# Patient Record
Sex: Female | Born: 1975
Health system: Southern US, Community
[De-identification: ages and names within clinical notes are randomized; demographics above are authoritative.]

## PROBLEM LIST (undated history)

## (undated) DIAGNOSIS — H4710 Unspecified papilledema: Secondary | ICD-10-CM

## (undated) DIAGNOSIS — F32A Depression, unspecified: Secondary | ICD-10-CM

## (undated) DIAGNOSIS — G43909 Migraine, unspecified, not intractable, without status migrainosus: Secondary | ICD-10-CM

## (undated) DIAGNOSIS — F419 Anxiety disorder, unspecified: Secondary | ICD-10-CM

## (undated) DIAGNOSIS — I10 Essential (primary) hypertension: Secondary | ICD-10-CM

## (undated) DIAGNOSIS — F329 Major depressive disorder, single episode, unspecified: Secondary | ICD-10-CM

## (undated) DIAGNOSIS — E559 Vitamin D deficiency, unspecified: Secondary | ICD-10-CM

## (undated) DIAGNOSIS — R51 Headache: Secondary | ICD-10-CM

## (undated) HISTORY — DX: Major depressive disorder, single episode, unspecified: F32.9

## (undated) HISTORY — DX: Depression, unspecified: F32.A

## (undated) HISTORY — DX: Unspecified papilledema: H47.10

## (undated) HISTORY — PX: EYE SURGERY: SHX253

## (undated) HISTORY — DX: Headache: R51

## (undated) HISTORY — DX: Anxiety disorder, unspecified: F41.9

## (undated) HISTORY — DX: Migraine, unspecified, not intractable, without status migrainosus: G43.909

## (undated) HISTORY — DX: Vitamin D deficiency, unspecified: E55.9

---

## 2000-04-02 ENCOUNTER — Other Ambulatory Visit: Admission: RE | Admit: 2000-04-02 | Discharge: 2000-04-02 | Payer: Self-pay | Admitting: Internal Medicine

## 2000-04-13 ENCOUNTER — Encounter: Payer: Self-pay | Admitting: Emergency Medicine

## 2000-04-13 ENCOUNTER — Emergency Department (HOSPITAL_COMMUNITY): Admission: EM | Admit: 2000-04-13 | Discharge: 2000-04-13 | Payer: Self-pay | Admitting: Emergency Medicine

## 2004-10-12 ENCOUNTER — Emergency Department (HOSPITAL_COMMUNITY): Admission: EM | Admit: 2004-10-12 | Discharge: 2004-10-13 | Payer: Self-pay | Admitting: Emergency Medicine

## 2005-03-18 ENCOUNTER — Encounter: Admission: RE | Admit: 2005-03-18 | Discharge: 2005-06-16 | Payer: Self-pay | Admitting: Family Medicine

## 2005-06-08 ENCOUNTER — Emergency Department (HOSPITAL_COMMUNITY): Admission: EM | Admit: 2005-06-08 | Discharge: 2005-06-09 | Payer: Self-pay | Admitting: Emergency Medicine

## 2007-09-09 ENCOUNTER — Emergency Department (HOSPITAL_BASED_OUTPATIENT_CLINIC_OR_DEPARTMENT_OTHER): Admission: EM | Admit: 2007-09-09 | Discharge: 2007-09-09 | Payer: Self-pay | Admitting: Emergency Medicine

## 2009-08-11 ENCOUNTER — Emergency Department (HOSPITAL_COMMUNITY): Admission: EM | Admit: 2009-08-11 | Discharge: 2009-08-11 | Payer: Self-pay | Admitting: Emergency Medicine

## 2010-02-27 ENCOUNTER — Emergency Department (HOSPITAL_COMMUNITY)
Admission: EM | Admit: 2010-02-27 | Discharge: 2010-02-27 | Payer: Self-pay | Source: Home / Self Care | Admitting: Emergency Medicine

## 2010-11-06 ENCOUNTER — Emergency Department (HOSPITAL_COMMUNITY): Payer: 59

## 2010-11-06 ENCOUNTER — Emergency Department (HOSPITAL_COMMUNITY)
Admission: EM | Admit: 2010-11-06 | Discharge: 2010-11-06 | Disposition: A | Discharge status: Home / Self Care | Payer: 59 | Attending: Emergency Medicine | Admitting: Emergency Medicine

## 2010-11-06 DIAGNOSIS — R0602 Shortness of breath: Secondary | ICD-10-CM | POA: Insufficient documentation

## 2010-11-06 DIAGNOSIS — R0789 Other chest pain: Secondary | ICD-10-CM | POA: Insufficient documentation

## 2010-11-06 DIAGNOSIS — I10 Essential (primary) hypertension: Secondary | ICD-10-CM | POA: Insufficient documentation

## 2010-11-06 DIAGNOSIS — F319 Bipolar disorder, unspecified: Secondary | ICD-10-CM | POA: Insufficient documentation

## 2010-11-06 DIAGNOSIS — R42 Dizziness and giddiness: Secondary | ICD-10-CM | POA: Insufficient documentation

## 2010-11-06 LAB — POCT I-STAT, CHEM 8
BUN: 17 mg/dL (ref 6–23)
Creatinine, Ser: 0.9 mg/dL (ref 0.50–1.10)
Glucose, Bld: 81 mg/dL (ref 70–99)
Hemoglobin: 13.3 g/dL (ref 12.0–15.0)
Sodium: 141 mEq/L (ref 135–145)
TCO2: 22 mmol/L (ref 0–100)

## 2010-11-06 LAB — POCT I-STAT TROPONIN I: Troponin i, poc: 0 ng/mL (ref 0.00–0.08)

## 2010-11-12 ENCOUNTER — Other Ambulatory Visit: Payer: Self-pay

## 2010-11-12 ENCOUNTER — Encounter: Payer: Self-pay | Admitting: *Deleted

## 2010-11-12 ENCOUNTER — Emergency Department (HOSPITAL_BASED_OUTPATIENT_CLINIC_OR_DEPARTMENT_OTHER)
Admission: EM | Admit: 2010-11-12 | Discharge: 2010-11-13 | Disposition: A | Discharge status: Home / Self Care | Payer: 59 | Attending: Emergency Medicine | Admitting: Emergency Medicine

## 2010-11-12 ENCOUNTER — Emergency Department (INDEPENDENT_AMBULATORY_CARE_PROVIDER_SITE_OTHER): Payer: 59

## 2010-11-12 DIAGNOSIS — R0602 Shortness of breath: Secondary | ICD-10-CM

## 2010-11-12 DIAGNOSIS — R079 Chest pain, unspecified: Secondary | ICD-10-CM

## 2010-11-12 DIAGNOSIS — IMO0002 Reserved for concepts with insufficient information to code with codable children: Secondary | ICD-10-CM | POA: Insufficient documentation

## 2010-11-12 DIAGNOSIS — X58XXXA Exposure to other specified factors, initial encounter: Secondary | ICD-10-CM | POA: Insufficient documentation

## 2010-11-12 DIAGNOSIS — I10 Essential (primary) hypertension: Secondary | ICD-10-CM | POA: Insufficient documentation

## 2010-11-12 DIAGNOSIS — Z79899 Other long term (current) drug therapy: Secondary | ICD-10-CM | POA: Insufficient documentation

## 2010-11-12 DIAGNOSIS — S29011A Strain of muscle and tendon of front wall of thorax, initial encounter: Secondary | ICD-10-CM

## 2010-11-12 HISTORY — DX: Essential (primary) hypertension: I10

## 2010-11-12 LAB — CBC
HCT: 33.1 % — ABNORMAL LOW (ref 36.0–46.0)
MCH: 29.6 pg (ref 26.0–34.0)
MCV: 83.8 fL (ref 78.0–100.0)
Platelets: 316 10*3/uL (ref 150–400)
RBC: 3.95 MIL/uL (ref 3.87–5.11)
RDW: 13.6 % (ref 11.5–15.5)
WBC: 9.5 10*3/uL (ref 4.0–10.5)

## 2010-11-12 MED ORDER — IOHEXOL 350 MG/ML SOLN
80.0000 mL | Freq: Once | INTRAVENOUS | Status: AC | PRN
  Administered 2010-11-12: 80 mL via INTRAVENOUS

## 2010-11-12 NOTE — ED Notes (Signed)
Pt c/o chest congestion , seen by Urgent care x 2 weeks ago, here today for same

## 2010-11-12 NOTE — ED Notes (Signed)
Pt states she has had chest pain, nausea, and shob. Pt was seen 5 days ago for same and had + d-dimer but left prior to having CT angio.

## 2010-11-13 LAB — CARDIAC PANEL(CRET KIN+CKTOT+MB+TROPI)
Relative Index: INVALID (ref 0.0–2.5)
Troponin I: <0.3 ng/mL (ref <0.30)

## 2010-11-13 LAB — BASIC METABOLIC PANEL
CO2: 19 mEq/L (ref 19–32)
Chloride: 104 mEq/L (ref 96–112)
GFR calc Af Amer: >60 mL/min (ref >60)
Potassium: 3.3 mEq/L — ABNORMAL LOW (ref 3.5–5.1)

## 2010-11-13 MED ORDER — IBUPROFEN 800 MG PO TABS: 800.0000 mg | ORAL_TABLET | Freq: Three times a day (TID) | ORAL | Status: AC

## 2010-11-13 NOTE — ED Provider Notes (Addendum)
History     CSN: [id number] Arrival date & time: 11/12/2010  9:26 PM   Chief Complaint  Patient presents with  . Nasal Congestion     (Include location/radiation/quality/duration/timing/severity/associated sxs/prior treatment) Patient is a 35 y.o. female presenting with chest pain. The history is provided by the patient. No language interpreter was used.  Chest Pain The chest pain began more than 2 weeks ago. Duration of episode(s) is 3 weeks. Chest pain occurs constantly. The chest pain is unchanged. Associated with: nothing. At its most intense, the pain is at 10/10. The pain is currently at 10/10. The severity of the pain is severe. The quality of the pain is described as sharp. The pain does not radiate. Exacerbated by: nothing. Pertinent negatives for primary symptoms include no fever, no fatigue, no syncope, no shortness of breath, no cough, no wheezing, no palpitations, no abdominal pain, no nausea, no vomiting, no dizziness and no altered mental status.  Pertinent negatives for associated symptoms include no claudication, no diaphoresis, no lower extremity edema, no near-syncope, no numbness, no orthopnea, no paroxysmal nocturnal dyspnea and no weakness. She tried nothing for the symptoms. Risk factors include no known risk factors.  Pertinent negatives for past medical history include no aneurysm, no aortic aneurysm, no aortic dissection, no arrhythmia, no CAD, no cancer, no congenital heart disease, no COPD, no Marfan's syndrome, no MI, no mitral valve prolapse, no PE, no PVD, no rheumatic fever, no seizures and no sickle cell disease.  Pertinent negatives for family medical history include: family history of aortic dissection.  Procedure history is negative for cardiac catheterization.      Past Medical History  Diagnosis Date  . Hypertension      History reviewed. No pertinent past surgical history.  History reviewed. No pertinent family history.  History  Substance Use  Topics  . Smoking status: Never Smoker   . Smokeless tobacco: Not on file  . Alcohol Use: No    OB History    Grav Para Term Preterm Abortions TAB SAB Ect Mult Living                  Review of Systems  Constitutional: Negative for fever, diaphoresis and fatigue.  HENT: Positive for congestion. Negative for facial swelling.   Eyes: Negative for discharge.  Respiratory: Negative for cough, shortness of breath and wheezing.   Cardiovascular: Positive for chest pain. Negative for palpitations, orthopnea, claudication, syncope and near-syncope.  Gastrointestinal: Negative for nausea, vomiting and abdominal pain.  Musculoskeletal: Negative.   Neurological: Negative for dizziness, seizures, weakness and numbness.  Hematological: Negative.   Psychiatric/Behavioral: Negative.  Negative for altered mental status.    Allergies  Review of patient's allergies indicates no known allergies.  Home Medications   Current Outpatient Rx  Name Route Sig Dispense Refill  . CALCIUM CARBONATE ANTACID 500 MG PO CHEW Oral Chew 2 tablets by mouth daily.      Marland Kitchen CLONAZEPAM 0.5 MG PO TABS Oral Take 0.5 mg by mouth 3 (three) times daily as needed. anxiety     . FERROUS SULFATE 325 (65 FE) MG PO TABS Oral Take 325 mg by mouth daily with breakfast.      . GUAIFENESIN 600 MG PO TB12 Oral Take 1,200 mg by mouth 2 (two) times daily.      Marland Kitchen MECLIZINE HCL 25 MG PO TABS Oral Take 25 mg by mouth 3 (three) times daily.      Marland Kitchen METOPROLOL SUCCINATE 25 MG PO TB24 Oral  Take 25 mg by mouth daily.      Marland Kitchen ONE-DAILY MULTI VITAMINS PO TABS Oral Take 1 tablet by mouth daily.      Marland Kitchen NAPROXEN SODIUM 220 MG PO TABS Oral Take 440 mg by mouth 2 (two) times daily with a meal.      . TOPIRAMATE 100 MG PO TABS Oral Take 100 mg by mouth daily.        Physical Exam    BP 131/74  Pulse 57  Temp(Src) 97.8 F (36.6 C) (Oral)  Resp 18  Ht 5\' 5"  (1.651 m)  Wt 285 lb (129.275 kg)  BMI 47.43 kg/m2  SpO2 100%  LMP  10/29/2010  Physical Exam  Constitutional: She is oriented to person, place, and time. She appears well-developed and well-nourished.  HENT:  Head: Normocephalic and atraumatic.  Mouth/Throat: Oropharynx is clear and moist.  Eyes: EOM are normal. Pupils are equal, round, and reactive to light. Right eye exhibits no discharge. Left eye exhibits no discharge.  Neck: Normal range of motion. Neck supple. No JVD present.  Cardiovascular: Normal rate and regular rhythm.   No murmur heard. Pulmonary/Chest: Breath sounds normal. No stridor. She is in respiratory distress. She has no wheezes. She exhibits no tenderness.  Abdominal: Soft. Bowel sounds are normal. There is no rebound and no guarding.  Musculoskeletal: Normal range of motion. She exhibits no edema and no tenderness.  Lymphadenopathy:    She has no cervical adenopathy.  Neurological: She is alert and oriented to person, place, and time.  Skin: Skin is warm and dry.  Psychiatric: She has a normal mood and affect.    ED Course  Procedures  Results for orders placed during the hospital encounter of 11/12/10  CBC      Component Value Range   WBC 9.5  4.0 - 10.5 (K/uL)   RBC 3.95  3.87 - 5.11 (MIL/uL)   Hemoglobin 11.7 (*) 12.0 - 15.0 (g/dL)   HCT 52.8 (*) 41.3 - 46.0 (%)   MCV 83.8  78.0 - 100.0 (fL)   MCH 29.6  26.0 - 34.0 (pg)   MCHC 35.3  30.0 - 36.0 (g/dL)   RDW 24.4  01.0 - 27.2 (%)   Platelets 316  150 - 400 (K/uL)  CARDIAC PANEL(CRET KIN+CKTOT+MB+TROPI)      Component Value Range   Total CK 84  7 - 177 (U/L)   CK, MB 2.4  0.3 - 4.0 (ng/mL)   Troponin I <0.30  <0.30 (ng/mL)   Relative Index RELATIVE INDEX IS INVALID  0.0 - 2.5   BASIC METABOLIC PANEL      Component Value Range   Sodium 138  135 - 145 (mEq/L)   Potassium 3.3 (*) 3.5 - 5.1 (mEq/L)   Chloride 104  96 - 112 (mEq/L)   CO2 19  19 - 32 (mEq/L)   Glucose, Bld 90  70 - 99 (mg/dL)   BUN 10  6 - 23 (mg/dL)   Creatinine, Ser 5.36  0.50 - 1.10 (mg/dL)    Calcium 9.4  8.4 - 10.5 (mg/dL)   GFR calc non Af Amer >60  >60 (mL/min)   GFR calc Af Amer >60  >60 (mL/min)   Dg Chest 2 View  11/06/2010  *RADIOLOGY REPORT*  Clinical Data: Chest pain, shortness of breath  CHEST - 2 VIEW  Comparison: None.  Findings: Lungs are clear. No pleural effusion or pneumothorax.  The heart is top normal in size.  Visualized osseous structures are within  normal limits.  IMPRESSION: No evidence of acute cardiopulmonary disease.  Original Report Authenticated By: Charline Bills, M.D.   Ct Angio Chest W/cm &/or Wo Cm  11/13/2010  *RADIOLOGY REPORT*  Clinical Data:  Chest pain, shortness of breath  CT ANGIOGRAPHY CHEST WITH CONTRAST  Technique:  Multidetector CT imaging of the chest was performed using the standard protocol during bolus administration of intravenous contrast.  Multiplanar CT image reconstructions including MIPs were obtained to evaluate the vascular anatomy.  Contrast:  80 ml Omnipaque 350  Comparison:  11/06/2010 radiograph  Findings:  There is no filling defect within the pulmonary arteries to suggest pulmonary embolism.  The aorta is of normal course and caliber.  The heart is upper normal limits to mildly enlarged.  No pericardial effusion.  No pleural effusion.  No intrathoracic lymphadenopathy.  The central airways are patent.  The upper lungs are clear.  Note that the lung bases are not included on this study.  No acute osseous abnormality.  Limited visualization of the liver bone shows no acute abnormality.  Review of the MIP images confirms the above findings.  IMPRESSION: No pulmonary embolism or acute intrathoracic process.  Original Report Authenticated By: Waneta Martins, M.D.     No diagnosis found.   MDM  Date: 11/13/2010  Rate: 58  Rhythm: sinus bradycardia  QRS Axis: normal  Intervals: normal  ST/T Wave abnormalities: nonspecific ST/T changes  Conduction Disutrbances:none  Narrative Interpretation: LVH with st t changes  Old EKG  Reviewed: unchanged  Follow up in the am with Dr. Paulino Rily, return for worsening symptoms      Nohlan Burdin K Serai Tukes-Rasch, MD 11/13/10 0525  Rylen Hou K Kristiane Morsch-Rasch, MD 11/13/10 0526  Keina Mutch K Lidwina Kaner-Rasch, MD 11/13/10 0526  Alejos Reinhardt K Kaytelynn Scripter-Rasch, MD 12/28/10 [phone]

## 2010-11-21 LAB — CBC
HCT: 35.2 — ABNORMAL LOW
Hemoglobin: 12.1
MCV: 89.7
WBC: 5.8

## 2010-11-21 LAB — COMPREHENSIVE METABOLIC PANEL
Albumin: 4.3
Alkaline Phosphatase: 45
BUN: 13
CO2: 31
Chloride: 97
Creatinine, Ser: 0.8
GFR calc non Af Amer: >60
Glucose, Bld: 106 — ABNORMAL HIGH
Potassium: 3.3 — ABNORMAL LOW
Total Bilirubin: 0.7

## 2010-11-21 LAB — DIFFERENTIAL
Basophils Absolute: 0.1
Basophils Relative: 1
Monocytes Absolute: 0.4
Neutro Abs: 3
Neutrophils Relative %: 53

## 2010-11-21 LAB — URINALYSIS, ROUTINE W REFLEX MICROSCOPIC
Hgb urine dipstick: NEGATIVE
Nitrite: NEGATIVE
Protein, ur: NEGATIVE
Urobilinogen, UA: 0.2

## 2010-11-21 LAB — URINE MICROSCOPIC-ADD ON

## 2010-11-21 LAB — PREGNANCY, URINE: Preg Test, Ur: NEGATIVE

## 2011-12-16 ENCOUNTER — Encounter (HOSPITAL_BASED_OUTPATIENT_CLINIC_OR_DEPARTMENT_OTHER): Payer: Self-pay

## 2011-12-16 ENCOUNTER — Emergency Department (HOSPITAL_BASED_OUTPATIENT_CLINIC_OR_DEPARTMENT_OTHER)
Admission: EM | Admit: 2011-12-16 | Discharge: 2011-12-16 | Disposition: A | Discharge status: Home / Self Care | Payer: Worker's Compensation | Attending: Emergency Medicine | Admitting: Emergency Medicine

## 2011-12-16 ENCOUNTER — Emergency Department (HOSPITAL_BASED_OUTPATIENT_CLINIC_OR_DEPARTMENT_OTHER): Payer: Worker's Compensation

## 2011-12-16 DIAGNOSIS — W010XXA Fall on same level from slipping, tripping and stumbling without subsequent striking against object, initial encounter: Secondary | ICD-10-CM | POA: Insufficient documentation

## 2011-12-16 DIAGNOSIS — Y929 Unspecified place or not applicable: Secondary | ICD-10-CM | POA: Insufficient documentation

## 2011-12-16 DIAGNOSIS — S8990XA Unspecified injury of unspecified lower leg, initial encounter: Secondary | ICD-10-CM | POA: Insufficient documentation

## 2011-12-16 DIAGNOSIS — Y939 Activity, unspecified: Secondary | ICD-10-CM | POA: Insufficient documentation

## 2011-12-16 DIAGNOSIS — W19XXXA Unspecified fall, initial encounter: Secondary | ICD-10-CM

## 2011-12-16 DIAGNOSIS — Z79899 Other long term (current) drug therapy: Secondary | ICD-10-CM | POA: Insufficient documentation

## 2011-12-16 DIAGNOSIS — I10 Essential (primary) hypertension: Secondary | ICD-10-CM | POA: Insufficient documentation

## 2011-12-16 MED ORDER — HYDROCODONE-ACETAMINOPHEN 5-325 MG PO TABS: 1.0000 | ORAL_TABLET | Freq: Three times a day (TID) | ORAL | Status: DC | PRN

## 2011-12-16 MED ORDER — HYDROCODONE-ACETAMINOPHEN 5-325 MG PO TABS
1.0000 | ORAL_TABLET | Freq: Once | ORAL | Status: AC
  Administered 2011-12-16: 1 via ORAL
  Filled 2011-12-16: qty 1

## 2011-12-16 NOTE — ED Notes (Signed)
Slipped in grease at work/felll approx 1pm-pain to left knee and thigh

## 2011-12-16 NOTE — ED Provider Notes (Signed)
History     CSN: [id number]  Arrival date & time 12/16/11  1434   First MD Initiated Contact with Patient 12/16/11 1503      Chief Complaint  Patient presents with  . Fall    (Consider location/radiation/quality/duration/timing/severity/associated sxs/prior treatment) HPI The patient presents after a mechanical fall.  She notes that she was in her usual state of health until the events which occurred just prior to sedation.  The patient states that she slipped, fell onto her left knee, and her left leg and awkwardly behind her.  She denies significant other trauma, any loss of consciousness, any ongoing pain in areas other than her lower extremities and hips.  Since onset has been soreness in both hips and her left knee most prominently.  The pain is worse with motion.  Pain is minimally improved with ice.  No distal dysesthesia or weakness. Past Medical History  Diagnosis Date  . Hypertension     History reviewed. No pertinent past surgical history.  No family history on file.  History  Substance Use Topics  . Smoking status: Never Smoker   . Smokeless tobacco: Not on file  . Alcohol Use: Yes    OB History    Grav Para Term Preterm Abortions TAB SAB Ect Mult Living                  Review of Systems  All other systems reviewed and are negative.    Allergies  Review of patient's allergies indicates no known allergies.  Home Medications   Current Outpatient Rx  Name Route Sig Dispense Refill  . ZOLPIDEM TARTRATE 10 MG PO TABS Oral Take 10 mg by mouth at bedtime as needed.    Marland Kitchen CALCIUM CARBONATE ANTACID 500 MG PO CHEW Oral Chew 2 tablets by mouth daily.      Marland Kitchen CLONAZEPAM 0.5 MG PO TABS Oral Take 0.5 mg by mouth 3 (three) times daily as needed. anxiety     . FERROUS SULFATE 325 (65 FE) MG PO TABS Oral Take 325 mg by mouth daily with breakfast.      . GUAIFENESIN ER 600 MG PO TB12 Oral Take 1,200 mg by mouth 2 (two) times daily.      Marland Kitchen MECLIZINE HCL 25 MG PO TABS  Oral Take 25 mg by mouth 3 (three) times daily.      Marland Kitchen METOPROLOL SUCCINATE ER 25 MG PO TB24 Oral Take 25 mg by mouth daily.      Marland Kitchen ONE-DAILY MULTI VITAMINS PO TABS Oral Take 1 tablet by mouth daily.      Marland Kitchen NAPROXEN SODIUM 220 MG PO TABS Oral Take 440 mg by mouth 2 (two) times daily with a meal.      . TOPIRAMATE 100 MG PO TABS Oral Take 100 mg by mouth daily.        BP 160/94  Pulse 72  Temp 98.8 F (37.1 C) (Oral)  Resp 16  Ht 5' 6.5" (1.689 m)  Wt 286 lb (129.729 kg)  BMI 45.47 kg/m2  SpO2 100%  LMP 12/02/2011  Physical Exam  Nursing note and vitals reviewed. Constitutional: She is oriented to person, place, and time. She appears well-developed and well-nourished. No distress.  HENT:  Head: Normocephalic and atraumatic.  Eyes: Conjunctivae normal and EOM are normal.  Cardiovascular: Normal rate and regular rhythm.   Pulmonary/Chest: Effort normal and breath sounds normal. No stridor. No respiratory distress.  Abdominal: She exhibits no distension.  Musculoskeletal: She exhibits no  edema.       Right hip: She exhibits tenderness and bony tenderness. She exhibits normal range of motion, normal strength, no swelling, no crepitus, no deformity and no laceration.       Left hip: She exhibits tenderness and bony tenderness. She exhibits normal range of motion, normal strength, no swelling, no crepitus, no deformity and no laceration.       Right knee: Normal.       Left knee: She exhibits decreased range of motion and bony tenderness. She exhibits no swelling, no effusion, no ecchymosis, no deformity, no laceration, no erythema, normal alignment, no LCL laxity, normal patellar mobility, normal meniscus and no MCL laxity. tenderness found. Lateral joint line tenderness noted. No medial joint line and no patellar tendon tenderness noted.       Right ankle: Normal.       Left ankle: Normal.  Neurological: She is alert and oriented to person, place, and time. No cranial nerve deficit.    Skin: Skin is warm and dry.  Psychiatric: She has a normal mood and affect.    ED Course  Procedures (including critical care time)  Labs Reviewed - No data to display No results found.   No diagnosis found.    MDM  This is a pleasant female presents after a mechanical fall.  On exam she is in no distress.  She is multiple areas of tenderness to palpation, she is appropriate neurovascular status.  The patient's x-rays were all negative, and given her preserved and the capacity with discomfort more than severe pain, there is low suspicion for occult fracture.  We discussed return precautions, should discharged with analgesics, PMD and orthopedics followup.  Gerhard Munch, MD 12/16/11 1626

## 2013-03-27 ENCOUNTER — Encounter (HOSPITAL_COMMUNITY): Payer: Self-pay

## 2013-03-27 ENCOUNTER — Other Ambulatory Visit (HOSPITAL_COMMUNITY): Payer: 59 | Attending: Psychiatry | Admitting: Psychiatry

## 2013-03-27 DIAGNOSIS — I10 Essential (primary) hypertension: Secondary | ICD-10-CM | POA: Insufficient documentation

## 2013-03-27 DIAGNOSIS — F411 Generalized anxiety disorder: Secondary | ICD-10-CM

## 2013-03-27 DIAGNOSIS — F332 Major depressive disorder, recurrent severe without psychotic features: Secondary | ICD-10-CM | POA: Insufficient documentation

## 2013-03-27 DIAGNOSIS — F331 Major depressive disorder, recurrent, moderate: Secondary | ICD-10-CM

## 2013-03-27 DIAGNOSIS — R45851 Suicidal ideations: Secondary | ICD-10-CM | POA: Insufficient documentation

## 2013-03-27 NOTE — Progress Notes (Signed)
Patient ID: Jessica MalayMichelle Dickerson, female   DOB: 14-Feb-1976, 38 y.o.   MRN: [id number] D:  This is a 38 yr old, single, African American female, who was referred per a previous [facility]-IOP pt, treatment for depressive and anxiety symptoms.  Admits to passive SI.  Denies a plan or intent.  Discussed safety options with pt at length.  Pt able to contract for safety.  No previous suicide attempts or gestures.  No previous psychiatric admissions.  Reports fluctuating appetite, decreased sleep, tearfulness, irritability, poor concentration, anhedonia, poor energy, no motivation and decreased self esteem.  Denies HI or A/V hallucinations.  Pt reports she has been struggling with the above symptoms for years, but they worsened a couple of months ago.  Stressors:  1)  Financial Strain:  Pt is currently filing for bankruptcy.  2)  Job [employer]) of 1 1/2 years.  Pt has been having attendance issues and not meeting her quotas.  3)  Limited support system.  Reports being more isolative.  "My friends are all getting married or having children." Patient's family is in [place], [place].  Family Hx:  Paternal Grandfather struggled with ETOH. Childhood:  Born in [place], [place].  Reports a normal childhood.  Parents were married.  Pt was the only child.  Pt states she had cataracts when she was born.  Had eye surgery at an early age.  Was teased because of wearing thick eye glasses.  Denies any trauma or abuse. Denies any drugs/ETOH issues.  Admits to drinking occasionally.  Denies any legal issues. Reports that her mother and two friends as being her support system.  Pt completed all forms.  Scored 31 on the burns.  Pt will attend [facility]-I[facility] for twelve days.  A:  Oriented pt.  Provided pt with an orientation folder.  Informed Dr. Mila PalmerSharon Wolters (PCP) of admit.  Will refer pt to a therapist and a psychiatrist.  Encouraged support groups.  Inquired if pt or if she's been around anyone who has been to [place] within the past 21 days.  Informed pt to not  attend with any flu-like symptoms.  R:  Pt receptive.

## 2013-03-27 NOTE — Progress Notes (Signed)
    Daily Group Progress Note  Program: IOP  Group Time: 9:00-12:00  Participation Level: Active  Behavioral Response: Appropriate  Type of Therapy:  Individual Therapy  Summary of Progress: Patient met with physician and case manager    Nancie Neas, COUNS

## 2013-03-28 ENCOUNTER — Other Ambulatory Visit (HOSPITAL_COMMUNITY): Payer: 59 | Admitting: Psychiatry

## 2013-03-28 ENCOUNTER — Encounter (HOSPITAL_COMMUNITY): Payer: Self-pay | Admitting: Psychiatry

## 2013-03-28 DIAGNOSIS — F329 Major depressive disorder, single episode, unspecified: Secondary | ICD-10-CM

## 2013-03-28 DIAGNOSIS — F32A Depression, unspecified: Secondary | ICD-10-CM

## 2013-03-28 NOTE — Progress Notes (Signed)
Psychiatric Assessment Adult  Patient Identification:  Jessica Fritz Date of Evaluation:  03/28/2013  Chief Complaint: Depression   HPI Patient is a 38 year old African American single employed female who is referred to intensive outpatient program for the management of depression.  Patient was recommended by a previous patient who did outpatient program.  Patient endorses increased depression and anxiety symptoms.  She has multiple stressors.  She has financial strain and recently filed for bankruptcy, her job is very stressful.  She is working at [employer] for more than a year and recently she is unable to meet her course.  She has limited family and social support.  Her parents live in [place], she is single and she does not have any contact with family members who live in this area.  She is single she has no children.  She feels sometimes lonely.  She was given Klonopin and Celexa by her primary care physician and she feels sometimes this is not working.  She admitted to crying spells, irritability, some mood swings, anhedonia with chronic feeling of hopelessness and helplessness.  She also endorsed passive suicidal thoughts but no plan.  She feels lack of energy, lack of attention and concentration.  There were no paranoia, delusion or any of the session but admitted isolation and socially withdrawn.  She endorsed she's been struggling with these symptoms for a long time however lately the symptoms are getting more intense and more frequent.  She admitted some days she does not go to work because she has no motivation or desire to do things.  She has missed work and she has a problem to keep the attendance.  Patient denies any drinking or using any illegal substances.  She denies any mania, hallucinations, paranoia, aggression or any violence.  She denies any flight of ideas, panic attack or any OCD symptoms. Review of Systems Physical Exam  Traumatic Brain Injury: No   Past Psychiatric  History: Diagnosis: Depression   Hospitalizations: Denies   Outpatient Care: Seen by Dr. Tomasa Randunningham but did not like and getting medication from primary care physician.    Substance Abuse Care: Denies   Self-Mutilation: Denies   Suicidal Attempts: Denies   Violent Behaviors: Denies    Past Medical History:   Past Medical History  Diagnosis Date  . Hypertension   . Anxiety   . Depression   . Headache(784.0)    History of Loss of Consciousness:  No Seizure History:  No Cardiac History:  No Allergies:  No Known Allergies Current Medications:  Current Outpatient Prescriptions  Medication Sig Dispense Refill  . atenolol (TENORMIN) 25 MG tablet Take 25 mg by mouth daily.      . calcium carbonate (TUMS - DOSED IN MG ELEMENTAL CALCIUM) 500 MG chewable tablet Chew 2 tablets by mouth daily.        . citalopram (CELEXA) 20 MG tablet Take 20 mg by mouth daily.      . clonazePAM (KLONOPIN) 0.5 MG tablet Take 0.5 mg by mouth 3 (three) times daily as needed. anxiety       . guaiFENesin (MUCINEX) 600 MG 12 hr tablet Take 1,200 mg by mouth 2 (two) times daily.        . Multiple Vitamin (MULTIVITAMIN) tablet Take 1 tablet by mouth daily.        Marland Kitchen. zolpidem (AMBIEN) 10 MG tablet Take 10 mg by mouth at bedtime as needed.       No current facility-administered medications for this visit.  Previous Psychotropic Medications:  Medication Dose   Celexa 20 mg daily   20 mg   Klonopin 0.5 mg                    Substance Abuse History in the last 12 months: Patient denies any history of alcohol or any illegal substance use.  Medical Consequences of Substance Abuse: Denies  Legal Consequences of Substance Abuse: Denies  Family Consequences of Substance Abuse: Denies  Blackouts:  No DT's:  No Withdrawal Symptoms:  No None  Social History: Current Place of Residence: Lives alone Place of Birth: [place] Family Members: Patient lives in [place] Marital Status:  Single Children:  None Relationships: None History of Abuse: none Occupational Experiences; Military History:  None. Legal History: None  Family History:   Family History  Problem Relation Age of Onset  . Alcohol abuse Paternal Grandfather     Mental Status Examination/Evaluation: Objective:  Appearance: Casual and Guarded  Eye Contact::  Fair  Speech:  Slow  Volume:  Decreased  Mood:  Sad and depressed   Affect:  Constricted  Thought Process:  Logical  Orientation:  Full (Time, Place, and Person)  Thought Content:  Rumination and Poverty of thought content  Suicidal Thoughts:  Yes.  without intent/plan  Homicidal Thoughts:  No  Judgement:  Fair  Insight:  Fair  Psychomotor Activity:  Decreased  Akathisia:  No  Handed:  Right  AIMS (if indicated):  None   Assets:  Communication Skills Desire for Improvement Housing     AXIS I Major Depression, Recurrent severe  AXIS II Deferred  AXIS III Past Medical History  Diagnosis Date  . Hypertension   . Anxiety   . Depression   . Headache(784.0)      AXIS IV economic problems, occupational problems, other psychosocial or environmental problems, problems related to social environment and problems with primary support group  AXIS V 51-60 moderate symptoms   Treatment Plan/Recommendations:  Plan of Care: Admit to intensive outpatient program   Laboratory:  None  Psychotherapy: Patient will start program and involved in groups   Medications: Consider increasing Celexa   Routine PRN Medications:  Klonopin as needed  Consultations: None   Safety Concerns:  Patient has passive suicidal thinking but no plan or any intent   Other:      [facility] 2/3/201511:48 AM

## 2013-03-28 NOTE — Progress Notes (Signed)
Patient ID: Martyn MalayMichelle Laux, female   DOB: [birth year], 38 y.o.   MRN: [id number]  Daily Group Progress Note  Program: IOP  Group Time: 9:00-10:30 am   Participation Level: Active   Behavioral Response: Appropriate, tearful  Type of Therapy: Process Group   Summary of Progress: Pt participated in group therapy portion of group, indicated that she tends to worry about finances, work stress and family member's expectations of her.   Group Time: 10:30 am - 12:00 pm   Participation Level: Active   Behavioral Response: Appropriate   Type of Therapy: Psycho-education Group   Summary of Progress: Pt participated in skills group focused on defining and developing healthy relationship boundaries.  [name], Ph.D., [facility]

## 2013-03-29 ENCOUNTER — Encounter (HOSPITAL_COMMUNITY): Payer: Self-pay | Admitting: Psychiatry

## 2013-03-29 ENCOUNTER — Other Ambulatory Visit (HOSPITAL_COMMUNITY): Payer: 59 | Admitting: Psychiatry

## 2013-03-29 DIAGNOSIS — F329 Major depressive disorder, single episode, unspecified: Secondary | ICD-10-CM

## 2013-03-29 DIAGNOSIS — F32A Depression, unspecified: Secondary | ICD-10-CM

## 2013-03-29 NOTE — Progress Notes (Signed)
Patient ID: Martyn MalayMichelle Gero, female   DOB: [birth year], 38 y.o.   MRN: [id number]  Daily Group Progress Note  Program: IOP  Group Time: 9:00-10:30   Participation Level: Active   Behavioral Response: Appropriate and Sharing   Type of Therapy: Group Therapy   Summary of Progress: Pt. report positive mood, smiles and laughs appropriately. Pt. Attributes positive mood to having slept well the night before. Pt. Discussed poor boundaries with parents who live in [place] but are over-involved in her life, inhibit her ability to feel independent.  Group Time: 10:45-12:00   Participation Level: Active   Behavioral Response: Appropriate and Sharing   Type of Therapy: Psycho-education Group   Summary of Progress: Pt. Participated in part 2 of skills group based on developing healthy relationship boundaries.  Shaune PollackBrown, Jennifer B, COUNS

## 2013-03-30 ENCOUNTER — Other Ambulatory Visit (HOSPITAL_COMMUNITY): Payer: 59 | Admitting: Psychiatry

## 2013-03-30 ENCOUNTER — Encounter (HOSPITAL_COMMUNITY): Payer: Self-pay | Admitting: Psychiatry

## 2013-03-30 DIAGNOSIS — F329 Major depressive disorder, single episode, unspecified: Secondary | ICD-10-CM

## 2013-03-30 DIAGNOSIS — F32A Depression, unspecified: Secondary | ICD-10-CM

## 2013-03-30 NOTE — Progress Notes (Signed)
Patient ID: Jessica Fritz, female   DOB: [birth year], 38 y.o.   MRN: [id number]  Program: IOP  Group Time: 9:00-10:30   Participation Level: Minimal   Behavioral Response: Distracted   Type of Therapy: Group Therapy   Summary of Progress: Pt. Reports euthymic mood. Participated in [employer] meditation. Pt. Was alert but did not share in group therapy portion of group.  Group Time: 10:30-12:00   Participation Level: Active   Behavioral Response: Appropriate and Sharing   Type of Therapy: Psycho-education Group   Summary of Progress: Pt. Participated in discussion about creating healthy boundaries. Pt. Was alert but did not share in psychoeducational portion of group.  [name], Ph.D., [facility]

## 2013-03-31 ENCOUNTER — Other Ambulatory Visit (HOSPITAL_COMMUNITY): Payer: 59

## 2013-03-31 DIAGNOSIS — F32A Depression, unspecified: Secondary | ICD-10-CM

## 2013-03-31 DIAGNOSIS — F329 Major depressive disorder, single episode, unspecified: Secondary | ICD-10-CM

## 2013-03-31 NOTE — Progress Notes (Unsigned)
Patient ID: Jessica MalayMichelle Fritz, female   DOB: 08-Apr-1975, 38 y.o.   MRN: [id number]  Program: IOP  Group Time: 9:00-10:30   Participation Level: Moderate  Behavioral Response: Depressed, tearful, sharing  Type of Therapy: Group Therapy   Summary of Progress: Pt. Participated in heartmath meditation. Pt. Discussed pain/shame of friends identifying her depression and challenge of asking for what she needs from sources of support. Pt. Identified that she needs understanding and patience from her support system. Pt. Shared that she feels pressure from her parents to get married and have children; feels burden of not meeting their expectations for what she should have at this point in her life.  Group Time: 10:30-12:00   Participation Level: Moderate  Behavioral Response: Depressed, tearful, sharing  Type of Therapy: Psycho-education Group   Summary of Progress: Pt. Participated in discussion about the use of journaling as a self-awareness and coping tool. [name], Ph.D., [facility]

## 2013-04-03 ENCOUNTER — Other Ambulatory Visit (HOSPITAL_COMMUNITY): Payer: 59 | Admitting: Psychiatry

## 2013-04-03 ENCOUNTER — Encounter (HOSPITAL_COMMUNITY): Payer: Self-pay | Admitting: Psychiatry

## 2013-04-03 DIAGNOSIS — F329 Major depressive disorder, single episode, unspecified: Secondary | ICD-10-CM

## 2013-04-03 DIAGNOSIS — F32A Depression, unspecified: Secondary | ICD-10-CM

## 2013-04-03 NOTE — Progress Notes (Signed)
    Daily Group Progress Note  Program: IOP  Group Time: 9:00-10:30  Participation Level: Minimal  Behavioral Response: Appropriate and Sharing  Type of Therapy:  Group Therapy  Summary of Progress: Pt. Indicated ok mood during morning check-in. Pt. Participated in heartmath meditation. Pt. Appeared flat and lethargic. Pt. Reported that she did not sleep well last night, got out of bed and went on the internet. Pt. Reported that she was challenged to get out of bed on Sunday, but was able to go to church and to the park with a friend's encouragement.     Group Time: 10:30-12:00  Participation Level:  Minimal  Behavioral Response: Appropriate  Type of Therapy: Psycho-education Group  Summary of Progress: Pt. Participated in grief/loss facilitated by Precious GildingBob Hamilton Brown, Jennifer B, COUNS

## 2013-04-04 ENCOUNTER — Other Ambulatory Visit (HOSPITAL_COMMUNITY): Payer: 59 | Admitting: Psychiatry

## 2013-04-04 ENCOUNTER — Encounter (HOSPITAL_COMMUNITY): Payer: Self-pay | Admitting: Psychiatry

## 2013-04-04 DIAGNOSIS — F329 Major depressive disorder, single episode, unspecified: Secondary | ICD-10-CM

## 2013-04-04 DIAGNOSIS — F32A Depression, unspecified: Secondary | ICD-10-CM

## 2013-04-04 MED ORDER — TRAZODONE HCL 50 MG PO TABS: ORAL_TABLET | ORAL | Status: DC

## 2013-04-04 NOTE — Progress Notes (Signed)
Patient ID: Jessica MalayMichelle Fritz, female   DOB: 12-12-1975, 38 y.o.   MRN: [id number] Patient seen today because she was complaining of poor sleep.  She feels her Ambien is not working.  She is taking Klonopin at bedtime however she is only sleeping 2-3 hours.  I recommend to stop Ambien.  We will try trazodone 50 mg half to one tablet at bedtime.  Discussed risk and benefits of medication.

## 2013-04-04 NOTE — Progress Notes (Signed)
    Daily Group Progress Note  Program: IOP  Group Time: 9:00-10:30  Participation Level: Active  Behavioral Response: Appropriate and Sharing  Type of Therapy:  Group Therapy  Summary of Progress: Pt. Shared that she felt tired, lethargic. Pt. Shared that she has not been sleeping well, indicated that use of electronics, internet, reality tv interfere with developing healthy bedtime routine.     Group Time: 10:30-12:00  Participation Level:  Active  Behavioral Response: Appropriate  Type of Therapy: Psycho-education Group  Summary of Progress: Pt. Participated in group focused on identifying cognitive distortions.  Shaune PollackBrown, Jennifer B, COUNS

## 2013-04-05 ENCOUNTER — Other Ambulatory Visit (HOSPITAL_COMMUNITY): Payer: 59

## 2013-04-05 ENCOUNTER — Telehealth (HOSPITAL_COMMUNITY): Payer: Self-pay | Admitting: Psychiatry

## 2013-04-06 ENCOUNTER — Other Ambulatory Visit (HOSPITAL_COMMUNITY): Payer: 59 | Admitting: Psychiatry

## 2013-04-06 ENCOUNTER — Encounter (HOSPITAL_COMMUNITY): Payer: Self-pay | Admitting: Psychiatry

## 2013-04-06 DIAGNOSIS — F32A Depression, unspecified: Secondary | ICD-10-CM

## 2013-04-06 DIAGNOSIS — F329 Major depressive disorder, single episode, unspecified: Secondary | ICD-10-CM

## 2013-04-06 NOTE — Progress Notes (Signed)
Patient ID: Martyn MalayMichelle Kracke, female   DOB: 02-May-1975, 38 y.o.   MRN: [id number]  Program: IOP  Group Time: 9:00-10:30   Participation Level: Active   Behavioral Response: Appropriate and Sharing   Type of Therapy: Group Therapy   Summary of Progress: Pt. Continues to present as alert, but quiet and guarded, sometimes tearful. Pt. Participated in process of theme related to stress of meeting seemingly unattainable expectatins, lack of control over job related outcomes, and feeling undervalued by employer.  Group Time: 10:30-12:00   Participation Level: Active   Behavioral Response: Appropriate   Type of Therapy: Psycho-education Group   Summary of Progress: Pt. Participated in group facilitated by the mental health association. Shaune PollackBrown, Jennifer B, COUNS

## 2013-04-07 ENCOUNTER — Encounter (HOSPITAL_COMMUNITY): Payer: Self-pay | Admitting: Psychiatry

## 2013-04-07 ENCOUNTER — Other Ambulatory Visit (HOSPITAL_COMMUNITY): Payer: 59 | Admitting: Psychiatry

## 2013-04-07 DIAGNOSIS — F32A Depression, unspecified: Secondary | ICD-10-CM

## 2013-04-07 DIAGNOSIS — F329 Major depressive disorder, single episode, unspecified: Secondary | ICD-10-CM

## 2013-04-07 NOTE — Progress Notes (Signed)
Patient ID: Martyn MalayMichelle Bruning, female   DOB: [birth year], 38 y.o.   MRN: [id number] Program: IOP  Group Time: 9:00-10:30   Participation Level: Active   Behavioral Response: Appropriate and Sharing   Type of Therapy: Group Therapy   Summary of Progress: Pt. Continues to present as alert, but quiet and guarded, sometimes tearful. Pt. Participated in process of theme related to identifying sources of anger, normalizing anger, and lessons that can be learned during periods of anger. Pt. Shared that accessing anger is a significant challenge for her and discussed childhood experiences that developed discomfort with expressing anger.  Group Time: 10:30-12:00   Participation Level: Active   Behavioral Response: Appropriate   Type of Therapy: Psycho-education Group   Summary of Progress: Pt. Participated in group focused on identifying core beliefs. Shaune PollackBrown, Jennifer B, COUNS

## 2013-04-07 NOTE — Progress Notes (Signed)
Patient ID: Jessica Fritz, female   DOB: [birth year], 38 y.o.   MRN: [id number] D:  Met briefly with pt.  Reports that the groups are helping her to learn better coping skills.  Continues to struggle with sadness and anxiety.  C/O decreased self esteem, indecisiveness, anhedonia, irritability, decreased concentration, motivation, appetite and sleep (3-5 hrs).  Pt continues to be very tearful.  Admits to SI, denies a plan or intent.  Discussed safety options with pt.  Pt able to contract for safety.  A:  Provided pt with support and feedback.  Pt to continue in [place].  R:  Pt receptive.

## 2013-04-10 ENCOUNTER — Encounter (HOSPITAL_COMMUNITY): Payer: Self-pay | Admitting: Psychiatry

## 2013-04-10 ENCOUNTER — Other Ambulatory Visit (HOSPITAL_COMMUNITY): Payer: 59 | Admitting: Psychiatry

## 2013-04-10 DIAGNOSIS — F329 Major depressive disorder, single episode, unspecified: Secondary | ICD-10-CM

## 2013-04-10 DIAGNOSIS — F32A Depression, unspecified: Secondary | ICD-10-CM

## 2013-04-10 NOTE — Progress Notes (Signed)
Patient ID: Martyn MalayMichelle Dresch, female   DOB: [birth year], 38 y.o.   MRN: [id number]  Group Time: 9:00-10:30   Participation Level: Active   Behavioral Response: Appropriate and Sharing   Type of Therapy: Group Therapy   Summary of Progress: Pt. Continues to present as alert, but quiet. Pt. Participated in heartmath meditation and guided visualization. Pt. Reported that she was feeling "so-so". Pt. Reports that she had uneventful weekend. Pt. Continues to be challenged to make meaningful connections during group process. Session focused on challenge of disconnection and sharing our needs with others who might be sources of support.  Group Time: 10:30-12:00   Participation Level: Active   Behavioral Response: Appropriate   Type of Therapy: Psycho-education Group   Summary of Progress: Pt. Participated in group focused on process of forgiveness. Shaune PollackBrown, Jennifer B, COUNS

## 2013-04-11 ENCOUNTER — Other Ambulatory Visit (HOSPITAL_COMMUNITY): Payer: 59

## 2013-04-12 ENCOUNTER — Other Ambulatory Visit (HOSPITAL_COMMUNITY): Payer: 59 | Admitting: Psychiatry

## 2013-04-12 DIAGNOSIS — F329 Major depressive disorder, single episode, unspecified: Secondary | ICD-10-CM

## 2013-04-12 DIAGNOSIS — F32A Depression, unspecified: Secondary | ICD-10-CM

## 2013-04-12 NOTE — Progress Notes (Signed)
Discharge Note  Patient:  Jessica MalayMichelle Fritz is an 38 y.o., female DOB:  Nov 04, 1975  Date of Admission:  03/27/2013  Date of Discharge: 04/12/2013   Reason for Admission: Depression and anxiety  Hospital Course: Patient started IOP and was continued on her medications which included Celexa 20 mg Klonopin 0.5 mg 3 times a day when necessary Mucinex multivitamin and Ambien 10 mg. Patient started attending groups and began opening up in groups and talking about her stressors. She also learned Ruthy DickHart Math to help her anxiety, patient was able to practice this and felt better. Because of her insomnia her Ambien was discontinued and she was started on trazodone 50 mg at bedtime with good results. Overall patient gradually stabilized with improved sleep and appetite and mood was brighter with no suicidal or homicidal ideation she was coping well and was tolerating her medications well.  Mental Status at Discharge: Patient was alert, oriented x3, affect was full mood was stable and bright with no suicidal or homicidal ideation. No hallucinations or delusions were noted. Speech and language were normal and musculoskeletal system was normal. Recent and remote memory was good, judgment and insight was good, concentration and recall are good.  Lab Results: No results found for this or any previous visit (from the past 48 hour(s)).  Current outpatient prescriptions:atenolol (TENORMIN) 25 MG tablet, Take 25 mg by mouth daily., Disp: , Rfl: ;  calcium carbonate (TUMS - DOSED IN MG ELEMENTAL CALCIUM) 500 MG chewable tablet, Chew 2 tablets by mouth daily.  , Disp: , Rfl: ;  citalopram (CELEXA) 20 MG tablet, Take 20 mg by mouth daily., Disp: , Rfl: ;  clonazePAM (KLONOPIN) 0.5 MG tablet, Take 0.5 mg by mouth 3 (three) times daily as needed. anxiety , Disp: , Rfl:  guaiFENesin (MUCINEX) 600 MG 12 hr tablet, Take 1,200 mg by mouth 2 (two) times daily.  , Disp: , Rfl: ;  Multiple Vitamin (MULTIVITAMIN) tablet, Take 1 tablet  by mouth daily.  , Disp: , Rfl: ;  traZODone (DESYREL) 50 MG tablet, Take 1/2 to 1 tab at bed time, Disp: 39 tablet, Rfl: 0  Axis Diagnosis:   Axis I: Anxiety Disorder NOS and Major Depression, Recurrent severe Axis II: Cluster C Traits Axis III:  Past Medical History  Diagnosis Date  . Hypertension   . Anxiety   . Depression   . Headache(784.0)    Axis IV: other psychosocial or environmental problems, problems related to social environment and problems with primary support group Axis V: 61-70 mild symptoms   Level of Care:  OP  Discharge destination:  Home  Is patient on multiple antipsychotic therapies at discharge:  No    Has Patient had three or more failed trials of antipsychotic monotherapy by history:  No  Patient phone:  [phone] (home)  Patient address:   [address] [place] [address],   Follow-up recommendations:  Activity:  As tolerated Diet:  Regular Other:  Followup for medications with mega Blankma NP and a [facility]  Comments:  None  The patient received suicide prevention pamphlet:  Yes   Margit Bandaadepalli, Bernie Ransford 04/12/2013, 12:47 PM

## 2013-04-12 NOTE — Patient Instructions (Addendum)
Patient completed MH-IOP today.  Will follow up with [name] on 04-17-13 @ 1pm.  Patient has been referred to [facility] for a therapist appointment.  Recommendation that pt return to work on 04-24-13, without any restrictions.  Encouraged support groups.

## 2013-04-12 NOTE — Progress Notes (Signed)
Patient ID: Jessica MalayMichelle Fritz, female   DOB: Dec 05, 1975, 38 y.o.   MRN: [id number] D: This is a 38 yr old, single, African American female, who was referred per a previous MH-IOP pt, treatment for depressive and anxiety symptoms. Admits to continued passive SI. Denies a plan or intent. Discussed safety options with pt at length. Pt able to contract for safety. No previous suicide attempts or gestures. No previous psychiatric admissions. Reports continued fluctuating sleep, irritability, poor concentration, and decreased self esteem. Denies HI or A/V hallucinations. Pt reports she has been struggling with the above symptoms for years, but they worsened a couple of months ago. Stressors: 1) Financial Strain: Pt is currently filing for bankruptcy. 2) Job [employer] of 1 1/2 years. Pt has been having attendance issues and not meeting her quotas. 3) Limited support system. Reports being more isolative. "My friends are all getting married or having children."  Patient[place] in [place]. Family Hx: Paternal Grandfather struggled with ETOH. Pt completed MH-IOP today.  A:  F/U with [name], NP on 04-17-13 @ 1pm.  [facility] therapist.  Pt is awaiting a call back from their office.  Encouraged support groups.  Called [place] and requested an extension.  Recommended that pt return to work, without any restrictions on 04-24-13.  R:  Pt receptive.

## 2013-04-13 ENCOUNTER — Other Ambulatory Visit (HOSPITAL_COMMUNITY): Payer: 59 | Admitting: Psychiatry

## 2013-04-13 ENCOUNTER — Encounter (HOSPITAL_COMMUNITY): Payer: Self-pay | Admitting: Psychiatry

## 2013-04-13 NOTE — Progress Notes (Signed)
Patient ID: Martyn MalayMichelle Glogowski, female   DOB: [birth year], 38 y.o.   MRN: [id number]  Group Time: 9:00-10:30   Participation Level: Active   Behavioral Response: Appropriate and Sharing   Type of Therapy: Group Therapy   Summary of Progress: Pt. Continues to present as alert, but quiet. Pt. Participated in heartmath meditation. Pt. Reported some mild anxiety associated with having some done in her house and not wanting repairmen to be there. Pt. Discussed ways to manage her anxiety such as inviting a friend over while the work is being done.   Group Time: 10:30-12:00   Participation Level: Active   Behavioral Response: Appropriate   Type of Therapy: Psycho-education Group   Summary of Progress: Pt. Participated in group focused on the wheel of life, discussion of life balance. Pt. Was encouraged to focus on one area that can be improved.  Shaune PollackBrown, Jennifer B, COUNS

## 2013-04-14 ENCOUNTER — Other Ambulatory Visit (HOSPITAL_COMMUNITY): Payer: 59

## 2013-04-17 ENCOUNTER — Other Ambulatory Visit (HOSPITAL_COMMUNITY): Payer: 59

## 2013-04-17 ENCOUNTER — Ambulatory Visit (HOSPITAL_COMMUNITY): Payer: Self-pay | Admitting: Psychiatry

## 2013-04-18 ENCOUNTER — Other Ambulatory Visit (HOSPITAL_COMMUNITY): Payer: 59

## 2013-04-19 ENCOUNTER — Other Ambulatory Visit (HOSPITAL_COMMUNITY): Payer: 59

## 2013-04-20 ENCOUNTER — Other Ambulatory Visit (HOSPITAL_COMMUNITY): Payer: 59

## 2013-04-21 ENCOUNTER — Other Ambulatory Visit (HOSPITAL_COMMUNITY): Payer: 59

## 2013-04-24 ENCOUNTER — Other Ambulatory Visit (HOSPITAL_COMMUNITY): Payer: 59

## 2013-04-25 ENCOUNTER — Other Ambulatory Visit (HOSPITAL_COMMUNITY): Payer: 59

## 2013-04-26 ENCOUNTER — Other Ambulatory Visit (HOSPITAL_COMMUNITY): Payer: 59

## 2013-04-27 ENCOUNTER — Other Ambulatory Visit (HOSPITAL_COMMUNITY): Payer: 59

## 2013-04-28 ENCOUNTER — Other Ambulatory Visit (HOSPITAL_COMMUNITY): Payer: 59

## 2013-05-01 ENCOUNTER — Other Ambulatory Visit (HOSPITAL_COMMUNITY): Payer: 59

## 2013-05-02 ENCOUNTER — Other Ambulatory Visit (HOSPITAL_COMMUNITY): Payer: 59

## 2013-05-03 ENCOUNTER — Other Ambulatory Visit (HOSPITAL_COMMUNITY): Payer: 59

## 2013-05-04 ENCOUNTER — Encounter (HOSPITAL_COMMUNITY): Payer: Self-pay | Admitting: Psychiatry

## 2013-05-04 ENCOUNTER — Other Ambulatory Visit (HOSPITAL_COMMUNITY): Payer: 59

## 2013-05-04 ENCOUNTER — Ambulatory Visit (INDEPENDENT_AMBULATORY_CARE_PROVIDER_SITE_OTHER): Payer: 59 | Admitting: Psychiatry

## 2013-05-04 VITALS — BP 138/89 | HR 87 | Ht 64.0 in | Wt 286.0 lb

## 2013-05-04 DIAGNOSIS — F332 Major depressive disorder, recurrent severe without psychotic features: Secondary | ICD-10-CM

## 2013-05-04 DIAGNOSIS — F339 Major depressive disorder, recurrent, unspecified: Secondary | ICD-10-CM | POA: Insufficient documentation

## 2013-05-04 MED ORDER — TRAZODONE HCL 50 MG PO TABS: ORAL_TABLET | ORAL | Status: DC

## 2013-05-04 MED ORDER — CITALOPRAM HYDROBROMIDE 20 MG PO TABS: 20.0000 mg | ORAL_TABLET | Freq: Every day | ORAL | Status: DC

## 2013-05-04 NOTE — Progress Notes (Signed)
Psychiatric Assessment Adult  Patient Identification:  Jessica Fritz Date of Evaluation:  05/04/2013 Chief Complaint: I'm here for an evaluation History of Chief Complaint:  No chief complaint on file.   HPI Patient is 37 year BF, with h/o depression and anxiety, for the last two years; she was recently discharged from IOP, on 04/11/13. Sleeping 3-4 hours, and other days 6-7 hours; appetite-fluctuates. Mood is depressed, anxious. She is taking Celexa 20 mg po QD, Clonazepam 0.5 mg TID prn, and Trazodone 50 mg HS for sleep. She denies any side effects from these medications.  She has c/o anhedonia, poor concentration, fatigue, poor sleep/appetite, feelings of hopelessness at times. She had suicidal ideations in the past, but no suicidal attempts. She endorses panic attacks 2-3 times a week. Currently, she denies SI/HI/AVH. Depressive symptoms affect all domains in her life, and have been constant for the past 2 years. She denies any psychiatric hospitalizations, due to the depression/anxiety. She works for [employer], and needs 2 days off during the week, when depression is severe and incapacitating. Will fill out paperwork for patient. Rtc 4 weeks.  Review of Systems Physical Exam  Depressive Symptoms: depressed mood, anhedonia, psychomotor retardation, fatigue, feelings of worthlessness/guilt, difficulty concentrating, hopelessness, recurrent thoughts of death, suicidal thoughts without plan, anxiety, panic attacks, insomnia, hypersomnia, loss of energy/fatigue, disturbed sleep, weight gain, increased appetite, decreased appetite,  (Hypo) Manic Symptoms:   Elevated Mood:  Yes Irritable Mood:  Yes Grandiosity:  No Distractibility:  No Labiality of Mood:  No Delusions:  No Hallucinations:  No Impulsivity:  No Sexually Inappropriate Behavior:  No Financial Extravagance:  No Flight of Ideas:  Yes  Anxiety Symptoms: Excessive Worry:  Yes Panic Symptoms:   Yes Agoraphobia:  Yes Obsessive Compulsive: No  Symptoms: None, Specific Phobias:  Claustrophobia Social Anxiety:  No  Psychotic Symptoms:  Hallucinations: No None Delusions:  No Paranoia:  Yes   Ideas of Reference:  No  PTSD Symptoms: Ever had a traumatic exposure:  No Had a traumatic exposure in the last month:  No Re-experiencing: No None Hypervigilance:  No Hyperarousal: No None Avoidance: No None  Traumatic Brain Injury: No   Past Psychiatric History: Diagnosis:   Hospitalizations: none   Outpatient Care: iop   Substance Abuse Care: none   Self-Mutilation:  none   Suicidal Attempts: none   Violent Behaviors:none    Past Medical History:   Past Medical History  Diagnosis Date  . Hypertension   . Anxiety   . Depression   . Headache(784.0)    History of Loss of Consciousness:  No Seizure History:  No Cardiac History:  No Allergies:  No Known Allergies Current Medications:  Current Outpatient Prescriptions  Medication Sig Dispense Refill  . atenolol (TENORMIN) 25 MG tablet Take 25 mg by mouth daily.      . calcium carbonate (TUMS - DOSED IN MG ELEMENTAL CALCIUM) 500 MG chewable tablet Chew 2 tablets by mouth daily.        . citalopram (CELEXA) 20 MG tablet Take 20 mg by mouth daily.      . clonazePAM (KLONOPIN) 0.5 MG tablet Take 0.5 mg by mouth 3 (three) times daily as needed. anxiety       . guaiFENesin (MUCINEX) 600 MG 12 hr tablet Take 1,200 mg by mouth 2 (two) times daily.        . Multiple Vitamin (MULTIVITAMIN) tablet Take 1 tablet by mouth daily.        . traZODone (DESYREL) 50  MG tablet Take 1/2 to 1 tab at bed time  39 tablet  0   No current facility-administered medications for this visit.    Previous Psychotropic Medications:  Medication Dose   citalopram   20 mg    clonazepam   0.5 tid prn    trazodone   50 mg HS               Substance Abuse History in the last 12 months: none  Substance Age of 1st Use Last Use Amount Specific Type   Nicotine  na      Alcohol  age 45  2 weeks ago  mixed drink, 12 oz   Cannabis      Opiates      Cocaine      Methamphetamines      LSD      Ecstasy      Benzodiazepines  age 86  yesterday  0.5 mg prn    Caffeine  age 72  today   2-3 cup of tea    Inhalants      Others:                          Medical Consequences of Substance Abuse: na   Legal Consequences of Substance Abuse: na   Family Consequences of Substance Abuse: na   Blackouts:  No DT's:  No Withdrawal Symptoms:  No None  Social History: Current Place of Residence: [place] Place of Birth: [place], [place] Family Members: none Marital Status:  Single Children: none   Sons: na   Daughters: na  Relationships: none  Education:  Automotive engineer some  Educational Problems/Performance: regular classes  Religious Beliefs/Practices: Christian  History of Abuse: none Occupational Experiences: yes, [employer] account executive History:  None. Legal History:  File for bankruptcy-in process  Hobbies/Interests: listen to music and play on computer   Family History:   Family History  Problem Relation Age of Onset  . Alcohol abuse Paternal Grandfather     Mental Status Examination/Evaluation: Objective:  Appearance: Casual and Fairly Groomed  Patent attorney::  Fair  Speech:  Normal Rate  Volume:  Decreased  Mood: Depressed/Anxious  Affect:  Depressed, Inappropriate and Restricted  Thought Process:  Goal Directed  Orientation:  Full (Time, Place, and Person)  Thought Content:  Obsessions and Rumination  Suicidal Thoughts:  No  Homicidal Thoughts:  No  Judgement:  Fair  Insight:  Fair  Psychomotor Activity:  Psychomotor Retardation  Akathisia:  No  Handed:  Right  AIMS (if indicated):  No abnormal movements  Assets:  Intimacy Leisure Time Physical Health Resilience Social Support Talents/Skills    Laboratory/X-Ray Psychological Evaluation(s)   na   Dr. Marius Ditch, NP   Assessment:  Axis I: Major  Depression, Recurrent severe  AXIS I Major Depression, Recurrent severe  AXIS II Deferred  AXIS III Past Medical History  Diagnosis Date  . Hypertension   . Anxiety   . Depression   . Headache(784.0)      AXIS IV economic problems, educational problems, housing problems, occupational problems, other psychosocial or environmental problems, problems related to legal system/crime, problems related to social environment, problems with access to health care services and problems with primary support group  AXIS V 41-50 serious symptoms   Treatment Plan/Recommendations: Patient is a 38 year old BF, with h/o MDD, recurrent, severe for the past 2 years. She was recently discharged from IOP on 04/11/13. She has never  had previous psychiatric hospitalizations. She denies any suicide attempts, only suicidal ideations. Currently, she denies SI/HI/AVH. Patient requesting paperwork to fill out for intermittent days off from work. Patient remains severely depressed/anxious, and it affects all domains of her life. Will agree on 2 days a week, for days off. Rtc in 4 weeks. She will follow up with [facility] for therapy.  Plan of Care: medications and therapy  Laboratory:  na  Psychotherapy: yes   Medications: citalopram 20 mg po QD; clonazepam 0.5 mg TID prn; trazodone 50 mg po QHS  Routine PRN Medications:  Yes  Consultations: none   Safety Concerns:  None   Other:      Kendrick Fries, NP 3/12/20151:07 PM

## 2013-05-05 ENCOUNTER — Other Ambulatory Visit (HOSPITAL_COMMUNITY): Payer: 59

## 2013-05-13 ENCOUNTER — Other Ambulatory Visit (HOSPITAL_COMMUNITY): Payer: Self-pay | Admitting: Psychiatry

## 2013-05-15 ENCOUNTER — Telehealth (HOSPITAL_COMMUNITY): Payer: Self-pay | Admitting: *Deleted

## 2013-05-15 NOTE — Telephone Encounter (Signed)
Chart reviewed, refill not appropriate, was filled 05/04/13.

## 2013-05-15 NOTE — Telephone Encounter (Signed)
Refill not appropriate, filled 05/04/13 

## 2013-06-05 ENCOUNTER — Telehealth (HOSPITAL_COMMUNITY): Payer: Self-pay

## 2013-06-05 ENCOUNTER — Ambulatory Visit (HOSPITAL_COMMUNITY): Payer: Self-pay | Admitting: Psychiatry

## 2013-06-06 ENCOUNTER — Ambulatory Visit (INDEPENDENT_AMBULATORY_CARE_PROVIDER_SITE_OTHER): Payer: 59 | Admitting: Psychiatry

## 2013-06-06 ENCOUNTER — Encounter (HOSPITAL_COMMUNITY): Payer: Self-pay | Admitting: Psychiatry

## 2013-06-06 ENCOUNTER — Other Ambulatory Visit (HOSPITAL_COMMUNITY): Payer: Self-pay | Admitting: *Deleted

## 2013-06-06 VITALS — BP 174/107 | HR 77 | Ht 66.0 in | Wt 300.8 lb

## 2013-06-06 DIAGNOSIS — F339 Major depressive disorder, recurrent, unspecified: Secondary | ICD-10-CM

## 2013-06-06 MED ORDER — TRAZODONE HCL 50 MG PO TABS: 50.0000 mg | ORAL_TABLET | Freq: Every day | ORAL | Status: DC

## 2013-06-06 MED ORDER — ARIPIPRAZOLE 2 MG PO TABS: 2.0000 mg | ORAL_TABLET | Freq: Every day | ORAL | Status: DC

## 2013-06-06 MED ORDER — CITALOPRAM HYDROBROMIDE 20 MG PO TABS: 20.0000 mg | ORAL_TABLET | Freq: Every day | ORAL | Status: DC

## 2013-06-06 NOTE — Telephone Encounter (Signed)
Refill requested for Citalopram

## 2013-06-06 NOTE — Progress Notes (Signed)
   [facility] Follow[facility] Out[facility]  Martyn MalayMichelle Wierzba 1975-11-16  Date:  06/06/13  Subjective: Patient is here for follow up  Patient is taking the following meds: Citalopram 20 mg po QD, trazodone 50 mg HS, Clonazepam 0.5 mg po TID prn. She is not sleeping well; appetite is fluctuating. Mood is fluctuating. Depression 8/10, Anxiety 6/10. She denies SI/HI/AVH. She is still dysphoric, pmr, low energy, and c/o anhedonia. No side effects from the medications. Her BP is elevated, and patient states she is anxious. Rtc in 4 weeks.   There were no vitals filed for this visit.  Mental Status Examination  Appearance: casual, fairly groomed  Alert: Yes Attention: fair  Cooperative: Yes Eye Contact: Fair Speech: normal  Psychomotor Activity: Normal Memory/Concentration: fair  Oriented: time/date, situation, day of week and month of year Mood: Anxious and Dysphoric Affect: Depressed and Flat Thought Processes and Associations: Goal Directed Fund of Knowledge: Fair Thought Content: preocupations Insight: Fair Judgement: Fair  Diagnosis:  MDD, recurrent, unspecified    Treatment Plan: Celexa 20 mg po QD Abilify 2 mg po QD Clonazepam 0.5 mg tid prn   Kendrick FriesBLANKMANN, Kongmeng Santoro, NP

## 2013-07-07 ENCOUNTER — Telehealth (HOSPITAL_COMMUNITY): Payer: Self-pay

## 2013-07-07 DIAGNOSIS — F339 Major depressive disorder, recurrent, unspecified: Secondary | ICD-10-CM

## 2013-07-07 MED ORDER — CITALOPRAM HYDROBROMIDE 20 MG PO TABS: 20.0000 mg | ORAL_TABLET | Freq: Every day | ORAL | Status: DC

## 2013-07-07 NOTE — Telephone Encounter (Signed)
Sent request to pharmacy for citalopram/MB

## 2013-07-11 ENCOUNTER — Ambulatory Visit (INDEPENDENT_AMBULATORY_CARE_PROVIDER_SITE_OTHER): Payer: 59 | Admitting: Psychiatry

## 2013-07-11 ENCOUNTER — Encounter (HOSPITAL_COMMUNITY): Payer: Self-pay | Admitting: Psychiatry

## 2013-07-11 VITALS — BP 138/90 | HR 75 | Ht 66.0 in | Wt 303.0 lb

## 2013-07-11 DIAGNOSIS — F331 Major depressive disorder, recurrent, moderate: Secondary | ICD-10-CM

## 2013-07-11 DIAGNOSIS — F339 Major depressive disorder, recurrent, unspecified: Secondary | ICD-10-CM

## 2013-07-11 MED ORDER — CITALOPRAM HYDROBROMIDE 20 MG PO TABS: 20.0000 mg | ORAL_TABLET | Freq: Every day | ORAL | Status: DC

## 2013-07-11 MED ORDER — TRAZODONE HCL 50 MG PO TABS: 50.0000 mg | ORAL_TABLET | Freq: Every day | ORAL | Status: DC

## 2013-07-11 MED ORDER — ARIPIPRAZOLE 2 MG PO TABS: 2.0000 mg | ORAL_TABLET | Freq: Every day | ORAL | Status: DC

## 2013-07-11 NOTE — Progress Notes (Signed)
   [facility]  Jessica MalayMichelle Fritz 04/15/1975  Date:  07/11/13 Subjective: Patient is here for follow up Sleeping and appetite fluctuates. Pt denies SI/HI/AVH.Pt doesn't have energy. She reports her insurance card doesn't pay for abilify; gave her a discount card. She should call back if they don't give her a discount. Pt is obese, we discussed importance in exercise and proper nutrition; this reflects her increased vitals. Her appearance is kempt; she just got her hair done, it's red and black braids. Rtc in 4 weeks.    Filed Vitals:   07/11/13 1625  BP: 138/90  Pulse: 75    Mental Status Examination  Appearance: casual  Alert: Yes Attention: fair  Cooperative: Yes Eye Contact: Fair Speech: slow  Psychomotor Activity: Psychomotor Retardation Memory/Concentration: fair  Oriented: time/date, day of week and month of year Mood: Anxious, Depressed and Dysphoric Affect: Constricted and Depressed Thought Processes and Associations: Coherent, Linear and Logical Fund of Knowledge: Fair Thought Content: preoccupations Insight: Fair Judgement: Fair  Diagnosis:  MDD, recurrent, moderate Treatment Plan:  Citalopram 20 mg po QD Abilify 2 mg po QD Trazodone 50 mg hs, 1-2 prn  Kendrick FriesBLANKMANN, Alianna Wurster, NP

## 2013-07-14 ENCOUNTER — Telehealth (HOSPITAL_COMMUNITY): Payer: Self-pay | Admitting: *Deleted

## 2013-07-14 NOTE — Telephone Encounter (Signed)
Applied for prior Authorization with [facility] for Abilify. Rep: Viviann Spare Informed them Abilify being used as adjunct therapy for depression. Remains symptomatic on Celexa alone. Gave tried/failed as Trazodone & Topamax and Celexa alone. Information sent by rep to pharmacy review. Office will be notified of decision by fax  Left message for pt: Have applied with insurance for Prior Authorization for Abilify.Will inform her of progress

## 2013-07-24 ENCOUNTER — Encounter (HOSPITAL_COMMUNITY): Payer: Self-pay | Admitting: *Deleted

## 2013-07-24 NOTE — Progress Notes (Signed)
[facility] authorized prescription for Aripiprazole 2 mg daily Effective 07/14/13 thru 07/15/14 Auth # [id number] Patient and pharmacy notified

## 2013-08-11 ENCOUNTER — Ambulatory Visit (INDEPENDENT_AMBULATORY_CARE_PROVIDER_SITE_OTHER): Payer: 59 | Admitting: Psychiatry

## 2013-08-11 ENCOUNTER — Encounter (HOSPITAL_COMMUNITY): Payer: Self-pay | Admitting: Psychiatry

## 2013-08-11 VITALS — BP 120/80 | HR 89 | Ht 65.0 in | Wt 303.0 lb

## 2013-08-11 DIAGNOSIS — F331 Major depressive disorder, recurrent, moderate: Secondary | ICD-10-CM

## 2013-08-11 DIAGNOSIS — F339 Major depressive disorder, recurrent, unspecified: Secondary | ICD-10-CM

## 2013-08-11 DIAGNOSIS — F411 Generalized anxiety disorder: Secondary | ICD-10-CM | POA: Insufficient documentation

## 2013-08-11 MED ORDER — HYDROXYZINE HCL 10 MG PO TABS: 10.0000 mg | ORAL_TABLET | Freq: Three times a day (TID) | ORAL | Status: DC | PRN

## 2013-08-11 MED ORDER — CITALOPRAM HYDROBROMIDE 20 MG PO TABS: 20.0000 mg | ORAL_TABLET | Freq: Every day | ORAL | Status: DC

## 2013-08-11 MED ORDER — TRAZODONE HCL 50 MG PO TABS: 50.0000 mg | ORAL_TABLET | Freq: Every day | ORAL | Status: DC

## 2013-08-11 NOTE — Progress Notes (Signed)
   [facility]  Jessica MalayMichelle Fritz 06/11/1975  Date:   08/11/13 Subjective: Pt is here for follow  Pt started taking abilify, had some side effects initially. She was only taking 1 mg. She was having stiffness, now taking whole 2 mg po abilify, and tolerating it. Body has gotten used to medication. Encouraged to take at night, secondary to sedation. Sleeping is fair. Eating is fair to poor. Pt is well groomed, she has a hair do. Depression 6/10, Anxiety 5/10. She denies Si/hi/avh. She still has some constricted affect. She tapered off clonazepam; will trial hydroxyzine prn anxiety.   There were no vitals filed for this visit.  Mental Status Examination  Appearance: casual  Alert: Yes Attention: fair  Cooperative: Yes Eye Contact: Fair Speech: fair  Psychomotor Activity: Decreased Memory/Concentration: fair  Oriented: time/date and day of week Mood: Anxious and Depressed Affect: Constricted and Depressed Thought Processes and Associations: Coherent and Goal Directed Fund of Knowledge: Fair Thought Content: preoccupations Insight: Fair Judgement: Fair  Diagnosis: MDD, recurrent, moderate GAD Treatment Plan:  Trazodone 50 mg po hs for insomnia Abilify 2 mg po daily Rtc in 4 weeks  Kendrick FriesBLANKMANN, Jessica Ruark, NP

## 2013-08-31 ENCOUNTER — Telehealth (HOSPITAL_COMMUNITY): Payer: Self-pay

## 2013-08-31 MED ORDER — TRAZODONE HCL 50 MG PO TABS: 50.0000 mg | ORAL_TABLET | Freq: Every day | ORAL | Status: DC

## 2013-09-14 ENCOUNTER — Other Ambulatory Visit (HOSPITAL_COMMUNITY): Payer: Self-pay | Admitting: Psychiatry

## 2013-09-19 ENCOUNTER — Other Ambulatory Visit (HOSPITAL_COMMUNITY): Payer: Self-pay | Admitting: *Deleted

## 2013-09-19 MED ORDER — HYDROXYZINE HCL 10 MG PO TABS: 10.0000 mg | ORAL_TABLET | Freq: Three times a day (TID) | ORAL | Status: DC | PRN

## 2013-09-19 NOTE — Telephone Encounter (Signed)
recv'd faxed refill request for hydroxyzine 10 mg tablets, Take 1 tablet 3 x daily as needed for anxiety, original quantity of RX #30  Reviewed chart and order wth Dr.Kumar. Prescription renewed with quantity increased to #90 by Dr. Lucianne MussKumar

## 2013-09-20 ENCOUNTER — Ambulatory Visit (HOSPITAL_COMMUNITY): Payer: Self-pay | Admitting: Psychiatry

## 2013-10-21 ENCOUNTER — Other Ambulatory Visit (HOSPITAL_COMMUNITY): Payer: Self-pay | Admitting: Psychiatry

## 2013-10-26 ENCOUNTER — Other Ambulatory Visit (HOSPITAL_COMMUNITY): Payer: Self-pay | Admitting: *Deleted

## 2013-10-26 MED ORDER — ARIPIPRAZOLE 2 MG PO TABS: 2.0000 mg | ORAL_TABLET | Freq: Every day | ORAL | Status: DC

## 2013-11-29 ENCOUNTER — Ambulatory Visit (HOSPITAL_COMMUNITY): Payer: Self-pay | Admitting: Psychiatry

## 2013-12-04 ENCOUNTER — Other Ambulatory Visit (HOSPITAL_COMMUNITY): Payer: Self-pay | Admitting: Psychiatry

## 2013-12-08 ENCOUNTER — Telehealth (HOSPITAL_COMMUNITY): Payer: Self-pay | Admitting: *Deleted

## 2013-12-08 MED ORDER — ARIPIPRAZOLE 2 MG PO TABS: 2.0000 mg | ORAL_TABLET | Freq: Every day | ORAL | Status: DC

## 2013-12-08 NOTE — Telephone Encounter (Signed)
Chart reviewed, refill given for Abilify but must come to appointment 12/27/13 before more refills can be given.

## 2013-12-26 ENCOUNTER — Ambulatory Visit (INDEPENDENT_AMBULATORY_CARE_PROVIDER_SITE_OTHER): Payer: 59 | Admitting: Psychiatry

## 2013-12-26 ENCOUNTER — Encounter (HOSPITAL_COMMUNITY): Payer: Self-pay | Admitting: Psychiatry

## 2013-12-26 VITALS — BP 148/74 | HR 85 | Ht 65.5 in | Wt 301.2 lb

## 2013-12-26 DIAGNOSIS — F411 Generalized anxiety disorder: Secondary | ICD-10-CM

## 2013-12-26 DIAGNOSIS — F331 Major depressive disorder, recurrent, moderate: Secondary | ICD-10-CM

## 2013-12-26 DIAGNOSIS — Z79899 Other long term (current) drug therapy: Secondary | ICD-10-CM

## 2013-12-26 MED ORDER — ARIPIPRAZOLE 2 MG PO TABS: 2.0000 mg | ORAL_TABLET | Freq: Every day | ORAL | Status: DC

## 2013-12-26 MED ORDER — HYDROXYZINE HCL 25 MG PO TABS: 25.0000 mg | ORAL_TABLET | Freq: Every evening | ORAL | Status: DC | PRN

## 2013-12-26 MED ORDER — CITALOPRAM HYDROBROMIDE 40 MG PO TABS: 40.0000 mg | ORAL_TABLET | Freq: Every day | ORAL | Status: DC

## 2013-12-26 NOTE — Progress Notes (Signed)
[facility] [id number] Progress Note  Martyn MalayMichelle [place] [id number] 38 y.o.  12/26/2013 4:28 PM  Chief Complaint: better overall  History of Present Illness: Depression overall is better but she still has bad episodes for 2-3 days each month. On those days she feels high anxiety, really low mood, low motivation, spends her days in beds, doesn't go out, energy is low, appetite is low, worthlessness and hopelessness, isolation and crying spells. Endorsing on going low motivation, decreased desire to interact with others anhedonia. Sleep is poor and she is getting about 4 hrs even with Trazodone.   Anxiety is slowly improving. Panic attacks have decreased to about 1 a month. Pt is taking Vistaril each night to help her sleep and reports next day fatigue.   Pt is taking Celexa, Abilify and Trazodone as prescribed and they are helping some. Endorsing SE of fatigue.   Suicidal Ideation: No Plan Formed: No Patient has means to carry out plan: No  Homicidal Ideation: No Plan Formed: No Patient has means to carry out plan: No  Review of Systems: Psychiatric: Agitation: Yes Hallucination: No Depressed Mood: Yes Insomnia: Yes Hypersomnia: No Altered Concentration: variable Feels Worthless: Yes Grandiose Ideas: No Belief In Special Powers: No New/Increased Substance Abuse: No Compulsions: No  Neurologic: Headache: No Seizure: No Paresthesias: No   Review of Systems  Constitutional: Positive for malaise/fatigue. Negative for fever and chills.  HENT: Negative for congestion, nosebleeds and sore throat.   Eyes: Negative for blurred vision, double vision and redness.  Respiratory: Positive for shortness of breath. Negative for cough and sputum production.   Cardiovascular: Positive for leg swelling. Negative for chest pain and palpitations.  Gastrointestinal: Positive for nausea. Negative for heartburn, vomiting and abdominal pain.  Musculoskeletal: Positive for back pain. Negative  for joint pain and neck pain.  Skin: Negative for itching and rash.  Neurological: Positive for tingling. Negative for dizziness, seizures and headaches.  Psychiatric/Behavioral: Positive for depression. Negative for suicidal ideas, hallucinations and substance abuse. The patient has insomnia. The patient is not nervous/anxious.      Past Medical Family, Social History: lives alone. Works at [employer].  reports that she has never smoked. She has never used smokeless tobacco. She reports that she drinks alcohol. She reports that she does not use illicit drugs.  Family History  Problem Relation Age of Onset  . Alcohol abuse Paternal Grandfather   . Anxiety disorder Neg Hx   . Bipolar disorder Neg Hx   . Depression Neg Hx   . Suicidality Neg Hx     Past Medical History  Diagnosis Date  . Hypertension   . Anxiety   . Depression   . AOZHYQMV(784.6Headache(784.0)     Outpatient Encounter Prescriptions as of 12/26/2013  Medication Sig  . ARIPiprazole (ABILIFY) 2 MG tablet Take 1 tablet (2 mg total) by mouth daily.  Marland Kitchen. atenolol (TENORMIN) 25 MG tablet Take 25 mg by mouth daily.  . calcium carbonate (TUMS - DOSED IN MG ELEMENTAL CALCIUM) 500 MG chewable tablet Chew 2 tablets by mouth daily.    . citalopram (CELEXA) 20 MG tablet Take 1 tablet (20 mg total) by mouth daily.  . furosemide (LASIX) 20 MG tablet Take 20 mg by mouth daily.  Marland Kitchen. guaiFENesin (MUCINEX) 600 MG 12 hr tablet Take 1,200 mg by mouth 2 (two) times daily.    . hydrOXYzine (ATARAX/VISTARIL) 10 MG tablet Take 1 tablet (10 mg total) by mouth 3 (three) times daily as needed for anxiety.  .Marland Kitchen  Multiple Vitamin (MULTIVITAMIN) tablet Take 1 tablet by mouth daily.    . traZODone (DESYREL) 50 MG tablet Take 1 tablet (50 mg total) by mouth at bedtime. Take 1 tab at bed time    Past Psychiatric History/Hospitalization(s): Anxiety: Yes Bipolar Disorder: No Depression: Yes Mania: No Psychosis: No Schizophrenia: No Personality Disorder:  No Hospitalization for psychiatric illness: No History of Electroconvulsive Shock Therapy: No Prior Suicide Attempts: No  Physical Exam: Constitutional:  There were no vitals taken for this visit.  General Appearance: alert, oriented, no acute distress  Musculoskeletal: Strength & Muscle Tone: within normal limits Gait & Station: normal Patient leans: N/A  Mental Status Examination/Evaluation: Objective: Attitude: Calm and cooperative  Appearance: Fairly Groomed, appears to be stated age  Patent attorneyye Contact::  Fair  Speech:  Clear and Coherent and Normal Rate  Volume:  Normal  Mood:  euthymic  Affect:  Restricted  Thought Process:  Goal Directed, Linear and Logical  Orientation:  Full (Time, Place, and Person)  Thought Content:  Negative  Suicidal Thoughts:  No  Homicidal Thoughts:  No  Judgement:  Good  Insight:  Good  Concentration: good  Memory: Immediate-good Recent-good Remote-good  Recall: fair  Language: fair  Gait and Station: normal  [employer] of Knowledge: average  Psychomotor Activity:  Decreased  Akathisia:  No  Handed:  Right  AIMS (if indicated):  Facial and Oral Movements  Muscles of Facial Expression: None, normal  Lips and Perioral Area: None, normal  Jaw: None, normal  Tongue: None, normal Extremity Movements: Upper (arms, wrists, hands, fingers): None, normal  Lower (legs, knees, ankles, toes): None, normal,  Trunk Movements:  Neck, shoulders, hips: None, normal,  Overall Severity : Severity of abnormal movements (highest score from questions above): None, normal  Incapacitation due to abnormal movements: None, normal  Patient's awareness of abnormal movements (rate only patient's report): No Awareness, Dental Status  Current problems with teeth and/or dentures?: No  Does patient usually wear dentures?: No    Assets:  Communication Skills Desire for Improvement Financial Resources/Insurance Housing Leisure Time Physical  Health Resilience Social Support Talents/Skills       Medical Decision Making (Choose Three): Established Problem, Stable/Improving (1), Review of Psycho-Social Stressors (1), Review or order clinical lab tests (1), Order AIMS Test (2), Review of Medication Regimen & Side Effects (2) and Review of New Medication or Change in Dosage (2)  Assessment: Axis I: MDD- recurrent, moderate; GAD  Axis II: deferred  Axis III:  Past Medical History  Diagnosis Date  . Hypertension   . Anxiety   . Depression   . Headache(784.0)    Axis IV: limited coping skills  Axis V: GAF 51-60   Plan: Abilify 2mg  po qHS for mood augmenation Increase Celexa to 40mg  po qHS to target mood and anxiety Increase Vistaril 25mg  at bedtime prn insomnia D/c Trazodone  Medication management with supportive therapy. Risks/benefits and SE of the medication discussed. Pt verbalized understanding and verbal consent obtained for treatment.  Affirm with the patient that the medications are taken as ordered. Patient expressed understanding of how their medications were to be used.  -some improvement of symptoms but having continued depression   Labs- pt will bring copy of recent labs for review; ordered EKG  Therapy: brief supportive therapy provided. Discussed psychosocial stressors in detail.   Declined therapy referral at this time  Pt denies SI and is at an acute low risk for suicide.Patient told to call clinic if any problems occur.  Patient advised to go to ER if they should develop SI/HI, side effects, or if symptoms worsen. Has crisis numbers to call if needed. Pt verbalized understanding.  F/up in 3 months or sooner if needed   Oletta Darter, MD 12/26/2013

## 2013-12-26 NOTE — Patient Instructions (Signed)
EKG call [phone] 

## 2014-03-13 ENCOUNTER — Telehealth (HOSPITAL_COMMUNITY): Payer: Self-pay

## 2014-03-13 NOTE — Telephone Encounter (Signed)
Patient called requesting possible change in medication due to Abilify now costing over $800 per month.  Questioned if patient was taking generic and patient stated she was not sure and would it work the same?  Stated her insurance company had suggested change to Risperdal.  Informed no prior authorizations received yet regarding the Abilify and would follow up with her pharmacy.  [facility] [address] who informed patient had requested name brand and would cost over $400 per month.  Pharmacist instructed order could be filled generic but patient had been wanting name brand.  Agreed to follow up with patient.  Message left in an attempt to follow up with patient to try generic Abilify first and then if problems to call back.

## 2014-03-29 ENCOUNTER — Encounter (HOSPITAL_COMMUNITY): Payer: Self-pay | Admitting: Psychiatry

## 2014-03-29 ENCOUNTER — Ambulatory Visit (INDEPENDENT_AMBULATORY_CARE_PROVIDER_SITE_OTHER): Payer: 59 | Admitting: Psychiatry

## 2014-03-29 VITALS — BP 146/82 | HR 73 | Ht 66.0 in | Wt 303.6 lb

## 2014-03-29 DIAGNOSIS — F331 Major depressive disorder, recurrent, moderate: Secondary | ICD-10-CM

## 2014-03-29 DIAGNOSIS — F411 Generalized anxiety disorder: Secondary | ICD-10-CM

## 2014-03-29 DIAGNOSIS — Z0000 Encounter for general adult medical examination without abnormal findings: Secondary | ICD-10-CM

## 2014-03-29 MED ORDER — CITALOPRAM HYDROBROMIDE 40 MG PO TABS: 40.0000 mg | ORAL_TABLET | Freq: Every day | ORAL | Status: DC

## 2014-03-29 MED ORDER — HYDROXYZINE HCL 25 MG PO TABS: 25.0000 mg | ORAL_TABLET | Freq: Every evening | ORAL | Status: DC | PRN

## 2014-03-29 MED ORDER — ARIPIPRAZOLE 5 MG PO TABS: 2.5000 mg | ORAL_TABLET | Freq: Every day | ORAL | Status: DC

## 2014-03-29 NOTE — Progress Notes (Signed)
Patient ID: Jessica MalayMichelle Fritz, female   DOB: Apr 06, 1975, [age] y.o.   MRN: [id number]  [facility] [id number] Progress Note  Jessica MalayMichelle Fritz [id number] 39 y.o.  03/29/2014 4:12 PM  Chief Complaint: so/so  History of Present Illness: 4-5 days a week she feels sad mood and low energy. It usually occurs on work days and increased stress from new position at work. Denies worthlessness and crying spells. Reports ongoing isolation and anhedonia. Sleep remains poor and broken. Pt is getting about 6 hrs. Appetite is generally low.   Anxiety is slowly improving and can't call last time she was anxious. Panic attacks have decreased and she can't recall last time it happened. Pt is taking Vistaril each night to help her sleep and reports next day fatigue.   Pt is taking Celexa, Abilify and Trazodone as prescribed and they are helping some. Endorsing SE of fatigue. Pt ran out of Abilify one month ago and symptoms seem to have gotten worse since them.   Suicidal Ideation: No Plan Formed: No Patient has means to carry out plan: No  Homicidal Ideation: No Plan Formed: No Patient has means to carry out plan: No  Review of Systems: Psychiatric: Agitation: No Hallucination: No Depressed Mood: Yes Insomnia: Yes Hypersomnia: No Altered Concentration: Yes Feels Worthless: No Grandiose Ideas: No Belief In Special Powers: No New/Increased Substance Abuse: No Compulsions: No  Neurologic: Headache: Yes Seizure: No Paresthesias: No   Review of Systems  Constitutional: Positive for malaise/fatigue. Negative for fever and chills.  HENT: Negative for congestion and sore throat.   Eyes: Negative for blurred vision, double vision and redness.  Respiratory: Negative for cough, shortness of breath and wheezing.   Cardiovascular: Negative for chest pain, palpitations and leg swelling.  Gastrointestinal: Negative for heartburn, nausea, vomiting and abdominal pain.  Musculoskeletal: Negative for myalgias,  back pain, joint pain and neck pain.  Skin: Negative for itching and rash.  Neurological: Positive for headaches. Negative for dizziness, seizures and loss of consciousness.  Psychiatric/Behavioral: Positive for depression. Negative for suicidal ideas, hallucinations and substance abuse. The patient has insomnia. The patient is not nervous/anxious.      Past Medical Family, Social History: lives alone. Works at [employer].  reports that she has never smoked. She has never used smokeless tobacco. She reports that she drinks alcohol. She reports that she does not use illicit drugs.  Family History  Problem Relation Age of Onset  . Alcohol abuse Paternal Grandfather   . Anxiety disorder Neg Hx   . Bipolar disorder Neg Hx   . Depression Neg Hx   . Suicidality Neg Hx     Past Medical History  Diagnosis Date  . Hypertension   . Anxiety   . Depression   . AOZHYQMV(784.6Headache(784.0)     Outpatient Encounter Prescriptions as of 03/29/2014  Medication Sig  . ARIPiprazole (ABILIFY) 2 MG tablet Take 1 tablet (2 mg total) by mouth daily.  Marland Kitchen. atenolol (TENORMIN) 25 MG tablet Take 25 mg by mouth daily.  . calcium carbonate (TUMS - DOSED IN MG ELEMENTAL CALCIUM) 500 MG chewable tablet Chew 2 tablets by mouth daily.    . citalopram (CELEXA) 40 MG tablet Take 1 tablet (40 mg total) by mouth daily.  . furosemide (LASIX) 20 MG tablet Take 20 mg by mouth daily.  Marland Kitchen. guaiFENesin (MUCINEX) 600 MG 12 hr tablet Take 1,200 mg by mouth 2 (two) times daily.    . hydrOXYzine (ATARAX/VISTARIL) 25 MG tablet Take 1 tablet (25  mg total) by mouth at bedtime as needed for anxiety (insomnia).  . Multiple Vitamin (MULTIVITAMIN) tablet Take 1 tablet by mouth daily.      Past Psychiatric History/Hospitalization(s): Anxiety: Yes Bipolar Disorder: No Depression: Yes Mania: No Psychosis: No Schizophrenia: No Personality Disorder: No Hospitalization for psychiatric illness: No History of Electroconvulsive Shock Therapy:  No Prior Suicide Attempts: No  Physical Exam: Constitutional:  BP 146/82 mmHg  Pulse 73  Ht  (1.676 m)  Wt 303 lb 9.6 oz (137.712 kg)  BMI 49.03 kg/m2  General Appearance: alert, oriented, no acute distress  Musculoskeletal: Strength & Muscle Tone: within normal limits Gait & Station: normal Patient leans: N/A  Mental Status Examination/Evaluation: Objective: Attitude: Calm and cooperative  Appearance: Fairly Groomed, appears to be stated age  Patent attorney::  Fair  Speech:  Clear and Coherent and Normal Rate  Volume:  Normal  Mood:  depressed  Affect:  Restricted  Thought Process:  Goal Directed, Linear and Logical  Orientation:  Full (Time, Place, and Person)  Thought Content:  Negative  Suicidal Thoughts:  No  Homicidal Thoughts:  No  Judgement:  Good  Insight:  Good  Concentration: good  Memory: Immediate-good Recent-good Remote-good  Recall: fair  Language: fair  Gait and Station: normal  [employer] of Knowledge: average  Psychomotor Activity:  Decreased  Akathisia:  No  Handed:  Right  AIMS (if indicated):  Facial and Oral Movements  Muscles of Facial Expression: None, normal  Lips and Perioral Area: None, normal  Jaw: None, normal  Tongue: None, normal Extremity Movements: Upper (arms, wrists, hands, fingers): None, normal  Lower (legs, knees, ankles, toes): None, normal,  Trunk Movements:  Neck, shoulders, hips: None, normal,  Overall Severity : Severity of abnormal movements (highest score from questions above): None, normal  Incapacitation due to abnormal movements: None, normal  Patient's awareness of abnormal movements (rate only patient's report): No Awareness, Dental Status  Current problems with teeth and/or dentures?: No  Does patient usually wear dentures?: No    Assets:  Communication Skills Desire for Improvement Financial Resources/Insurance Housing Leisure Time Physical Health Resilience Social Support Talents/Skills        Medical Decision Making (Choose Three): Established Problem, Stable/Improving (1), Review of Psycho-Social Stressors (1), Established Problem, Worsening (2), Review of Medication Regimen & Side Effects (2) and Review of New Medication or Change in Dosage (2)  Assessment: Axis I: MDD- recurrent, moderate; GAD  Axis II: deferred  Axis III:  Past Medical History  Diagnosis Date  . Hypertension   . Anxiety   . Depression   . Headache(784.0)    Axis IV: limited coping skills  Axis V: GAF 51-60   Plan: Abilify 2.5mg  po qHS for mood augmenation Celexa   po qHS to target mood and anxiety Vistaril  at bedtime prn insomnia  Medication management with supportive therapy. Risks/benefits and SE of the medication discussed. Pt verbalized understanding and verbal consent obtained for treatment.  Affirm with the patient that the medications are taken as ordered. Patient expressed understanding of how their medications were to be used.  -worsening of symptoms  Labs- pt will bring copy of recent labs for review; ordered EKG  Therapy: brief supportive therapy provided. Discussed psychosocial stressors in detail.   Declined therapy referral at this time  Pt denies SI and is at an acute low risk for suicide.Patient told to call clinic if any problems occur. Patient advised to go to ER if they should develop SI/HI,  side effects, or if symptoms worsen. Has crisis numbers to call if needed. Pt verbalized understanding.  F/up in 2 months or sooner if needed   Oletta Darter, MD 03/29/2014

## 2014-04-03 ENCOUNTER — Telehealth (HOSPITAL_COMMUNITY): Payer: Self-pay

## 2014-04-03 NOTE — Telephone Encounter (Signed)
Telephone call from patient stating she went to fill her changed prescription for Abilify to 5mg , 1/2 tablet by mouth daily but could not afford the medication as it was now over $200 per month.  Called patient's [facility] pharmacy to question patient's cost of filling Abilify medication and if a prior authorization was needed.  Pharmacist reported no prior authorization was needed but that patient's yearly deductible had increased to $2,500 for this year and changing the medication would still have the same deductible.  Called patient back to inform of what was learned with call with pharmacist.  Patient reported trying to use and online coupon for the Abilify but her pharmacy reported it would have to be a coupon card.  Patient requested a coupon from provider if she had any and agreed to send her a note to question.

## 2014-05-27 ENCOUNTER — Other Ambulatory Visit (HOSPITAL_COMMUNITY): Payer: Self-pay | Admitting: Psychiatry

## 2014-05-27 NOTE — Telephone Encounter (Signed)
Patient returns to see Dr. Michae KavaAgarwal on 05/31/14 and requests refill of Celexa.

## 2014-05-31 ENCOUNTER — Telehealth (HOSPITAL_COMMUNITY): Payer: Self-pay | Admitting: *Deleted

## 2014-05-31 ENCOUNTER — Other Ambulatory Visit (HOSPITAL_COMMUNITY): Payer: Self-pay | Admitting: Psychiatry

## 2014-05-31 ENCOUNTER — Ambulatory Visit (HOSPITAL_COMMUNITY): Payer: Self-pay | Admitting: Psychiatry

## 2014-05-31 NOTE — Telephone Encounter (Signed)
Patient was late to appointment, was not seen.  Patient wanted to discuss price of Abilify generic too expensive.  Patient stated she would like to know if there is something else she can take.  I advised patient I will talk to Dr. Michae KavaAgarwal and call her back.   I spoke with Dr. Michae KavaAgarwal.  Per Dr. Michae KavaAgarwal she will discuss medication cost and changes on next scheduled appointments.  No changes will be made at this time.   LMOM for patient to call back

## 2014-06-13 ENCOUNTER — Other Ambulatory Visit (HOSPITAL_COMMUNITY): Payer: Self-pay | Admitting: Psychiatry

## 2014-06-18 ENCOUNTER — Other Ambulatory Visit (HOSPITAL_COMMUNITY): Payer: Self-pay | Admitting: Psychiatry

## 2014-06-19 ENCOUNTER — Ambulatory Visit (HOSPITAL_COMMUNITY): Payer: Self-pay | Admitting: Psychiatry

## 2014-06-19 NOTE — Telephone Encounter (Signed)
Refill request for Celexa declined as a new order for 5 pills was e-scribed 06/14/14 with instruction patient must keep appointment today 06/19/14 for further orders.

## 2014-07-22 ENCOUNTER — Other Ambulatory Visit (HOSPITAL_COMMUNITY): Payer: Self-pay | Admitting: Psychiatry

## 2014-08-14 ENCOUNTER — Ambulatory Visit (HOSPITAL_COMMUNITY): Payer: Self-pay | Admitting: Psychiatry

## 2014-08-14 ENCOUNTER — Telehealth (HOSPITAL_COMMUNITY): Payer: Self-pay

## 2014-08-22 ENCOUNTER — Telehealth (HOSPITAL_COMMUNITY): Payer: Self-pay

## 2014-08-22 NOTE — Telephone Encounter (Signed)
Met with Dr. Dwyane Dee to discuss patient's requested refills and past missed appointments and last dates new orders prepared for patient.  Patient's is scheduled to see Dr. Salem Senate on 08/29/14 and will need to discuss refills and restart of medication at that time per Dr. Dwyane Dee.  Left patient a message of need to keep her appointment with Dr. Salem Senate on 08/29/14 to discuss any further refills as none would be prepared at this time until patient seen for an evaluation.

## 2014-08-22 NOTE — Telephone Encounter (Signed)
Telephone message left for patient after she called requesting refills of medications.  Patient came in on 08/14/14 but had been cancelled that date due to provider going out on maternity leave.  Patient last evaluated on 03/29/14 with plan to return in 2 months.  Patient no showed on 05/31/14 and 06/19/14 before being cancelled on 08/14/14.  Patient had 2 months of Abilify and Celexa and Hydroxyzine written from April and Dr. Michae KavaAgarwal wrote on a 5 day supply of Celexa in April no further refills until seen.  Patient is scheduled with Dr. Rutherford Limerickadepalli on 09/04/14.  Left message patient's request would be sent to Dr. Rutherford Limerickadepalli and Dr. Lolly MustacheArfeen but not sure any medications would be filled until she is seen on 09/04/14

## 2014-08-23 ENCOUNTER — Telehealth (HOSPITAL_COMMUNITY): Payer: Self-pay | Admitting: *Deleted

## 2014-08-23 NOTE — Telephone Encounter (Signed)
Prior authorization received for Ariprazole 2mg . Called [phone] spoke with Shawna OrleansMelanie and explained patient is on 2.5mg  given in 5mg  tablets. Also explained that according to her record she may not be compliant since ordered February with 1 refill given. Called [facility] pharmacy spoke to "Dayna BarkerRamesh" who states it was an old prescription that she wanted filled. Explained the situation and the last current dosage. He states that she still has 1 refill on file with the prescription from February that she never filled. Told him that I did get approval for the medication but at the current dosage since that is the last one we have on file. Insurance agent Shawna OrleansMelanie explained that once approved all dosages will be included. Approval [id number] good until 08/23/2015.

## 2014-08-29 ENCOUNTER — Ambulatory Visit (INDEPENDENT_AMBULATORY_CARE_PROVIDER_SITE_OTHER): Payer: 59 | Admitting: Psychiatry

## 2014-08-29 ENCOUNTER — Encounter (HOSPITAL_COMMUNITY): Payer: Self-pay | Admitting: Psychiatry

## 2014-08-29 VITALS — BP 134/84 | HR 72 | Ht 65.5 in | Wt 301.6 lb

## 2014-08-29 DIAGNOSIS — F331 Major depressive disorder, recurrent, moderate: Secondary | ICD-10-CM

## 2014-08-29 DIAGNOSIS — F411 Generalized anxiety disorder: Secondary | ICD-10-CM | POA: Diagnosis not present

## 2014-08-29 MED ORDER — CITALOPRAM HYDROBROMIDE 40 MG PO TABS: 40.0000 mg | ORAL_TABLET | Freq: Every day | ORAL | Status: DC

## 2014-08-29 MED ORDER — ARIPIPRAZOLE 5 MG PO TABS: 5.0000 mg | ORAL_TABLET | Freq: Every day | ORAL | Status: DC

## 2014-08-29 MED ORDER — HYDROXYZINE HCL 50 MG PO TABS
50.0000 mg | ORAL_TABLET | Freq: Every day | ORAL | Status: DC
Start: 2014-08-29 — End: 2014-11-22

## 2014-08-29 NOTE — Progress Notes (Signed)
[facility] [id number] Progress Note  Jessica MalayMichelle Fritz [id number] 39 y.o.  08/29/2014 8:13 AM  Chief Complaint: I feel anxious and I have insomnia.   History of Present Illness: 39 year old African-American single female seen for the first time by Dr. Lauralyn PrimesADE PA [facility]. Patient sees Dr. Michae KavaAgarwal who is presently on leave.  carries a previous diagnosis of depression and anxiety and states that due to insurance noncoverage was unable to get Abilify which she started last week. Reports that her sleep is poor with initial and middle insomnia, appetite tends to fluctuate she has lost 4 pounds. Mood tends to be dysphoric and anxious. Denies suicidal or homicidal ideation no hallucinations or delusions. Feels tired and states that she was recently diagnosed with prediabetes and has been asked to lose weight. Patient also states that she ran out of her Vistaril.  Denies panic attacks although tends to ruminate a lot. She works at [employer] as a Administrator, sportsprovider data and also suffers from hypertension and vitamin D deficiency. Discussed with the patient continuing her medications and increasing Abilify to 5 mg and she is comfortable with that. .   Suicidal Ideation: No Plan Formed: No Patient has means to carry out plan: No  Homicidal Ideation: No Plan Formed: No Patient has means to carry out plan: No  Review of Systems: Psychiatric: Agitation: No Hallucination: No Depressed Mood: Yes Insomnia: Yes Hypersomnia: No Altered Concentration: Yes Feels Worthless: No Grandiose Ideas: No Belief In Special Powers: No New/Increased Substance Abuse: No Compulsions: No  Neurologic: Headache: Yes tension headaches Seizure: No Paresthesias: No   Review of Systems  Constitutional: Positive for malaise/fatigue. Negative for fever and chills.  HENT: Negative for congestion and sore throat.   Eyes: Negative for blurred vision, double vision and redness.  Respiratory: Negative for cough, shortness of  breath and wheezing.   Cardiovascular: Negative for chest pain, palpitations and leg swelling.  Gastrointestinal: Negative for heartburn, nausea, vomiting and abdominal pain.  Genitourinary: Negative.   Musculoskeletal: Negative for myalgias, back pain, joint pain and neck pain.  Skin: Negative for itching and rash.  Neurological: Positive for headaches. Negative for dizziness, seizures and loss of consciousness.  Endo/Heme/Allergies: Negative.   Psychiatric/Behavioral: Positive for depression. Negative for suicidal ideas, hallucinations and substance abuse. The patient is nervous/anxious and has insomnia.      Past Medical Family, Social History: lives alone. Works at [employer].  reports that she has never smoked. She has never used smokeless tobacco. She reports that she drinks alcohol. She reports that she does not use illicit drugs.  Family History  Problem Relation Age of Onset  . Alcohol abuse Paternal Grandfather   . Anxiety disorder Neg Hx   . Bipolar disorder Neg Hx   . Depression Neg Hx   . Suicidality Neg Hx     Past Medical History  Diagnosis Date  . Hypertension   . Anxiety   . Depression   . AOZHYQMV(784.6Headache(784.0)     Outpatient Encounter Prescriptions as of 08/29/2014  Medication Sig  . ARIPiprazole (ABILIFY) 5 MG tablet Take 0.5 tablets (2.5 mg total) by mouth daily.  Marland Kitchen. atenolol (TENORMIN) 25 MG tablet Take 25 mg by mouth daily.  . calcium carbonate (TUMS - DOSED IN MG ELEMENTAL CALCIUM) 500 MG chewable tablet Chew 2 tablets by mouth daily.    . citalopram (CELEXA) 40 MG tablet TAKE 1 TABLET BY MOUTH EVERY DAY  . furosemide (LASIX) 20 MG tablet Take 20 mg by mouth daily.  .Marland Kitchen  guaiFENesin (MUCINEX) 600 MG 12 hr tablet Take 1,200 mg by mouth 2 (two) times daily.    . hydrOXYzine (ATARAX/VISTARIL) 25 MG tablet Take 1 tablet (25 mg total) by mouth at bedtime as needed for anxiety (insomnia).  . Multiple Vitamin (MULTIVITAMIN) tablet Take 1 tablet by mouth daily.      No facility-administered encounter medications on file as of 08/29/2014.    Past Psychiatric History/Hospitalization(s): Anxiety: Yes Bipolar Disorder: No Depression: Yes Mania: No Psychosis: No Schizophrenia: No Personality Disorder: No Hospitalization for psychiatric illness: No History of Electroconvulsive Shock Therapy: No Prior Suicide Attempts: No  Physical Exam: Constitutional:  There were no vitals taken for this visit.  General Appearance: alert, oriented, no acute distress,  Musculoskeletal: Strength & Muscle Tone: within normal limits Gait & Station: normal Patient leans: N/A  Mental Status Examination/Evaluation: Objective: Attitude: Calm and cooperative  Appearance: Fairly Groomed, appears to be stated age, obese lady   Patent attorney::  Fair  Speech:  Clear and Coherent and Normal Rate  Volume:  Normal  Mood:  depressed  Affect:  Restricted  Thought Process:  Goal Directed, Linear and Logical  Orientation:  Full (Time, Place, and Person)  Thought Content:  Negative  Suicidal Thoughts:  No  Homicidal Thoughts:  No  Judgement:  Good  Insight:  Good  Concentration: good  Memory: Immediate-good Recent-good Remote-good  Recall: fair  Language: fair  Gait and Station: normal  [employer] of Knowledge: average  Psychomotor Activity:  Decreased  Akathisia:  No  Handed:  Right  AIMS (if indicated):  Facial and Oral Movements  Muscles of Facial Expression: None, normal  Lips and Perioral Area: None, normal  Jaw: None, normal  Tongue: None, normal Extremity Movements: Upper (arms, wrists, hands, fingers): None, normal  Lower (legs, knees, ankles, toes): None, normal,  Trunk Movements:  Neck, shoulders, hips: None, normal,  Overall Severity : Severity of abnormal movements (highest score from questions above): None, normal  Incapacitation due to abnormal movements: None, normal  Patient's awareness of abnormal movements (rate only patient's report):  No Awareness, Dental Status  Current problems with teeth and/or dentures?: No  Does patient usually wear dentures?: No    Assets:  Communication Skills Desire for Improvement Financial Resources/Insurance Housing Leisure Time Physical Health Resilience Social Support Talents/Skills       Medical Decision Making (Choose Three): Established Problem, Stable/Improving (1), Review of Psycho-Social Stressors (1), Established Problem, Worsening (2), Review of Medication Regimen & Side Effects (2) and Review of New Medication or Change in Dosage (2)  Assessment: Axis I: MDD- recurrent, moderate; GAD  Axis II: deferred  Axis III: Prediabetes Past Medical History  Diagnosis Date  . Hypertension   . Anxiety   . Depression   . Headache(784.0)    Axis IV: limited coping skills  Axis V: GAF 51-60   Plan: Increase and continue Abilify 5 po qHS for mood augmenation Celexa  40mg  po qHS to target mood and anxiety Vistaril 50mg  at bedtime  insomnia  Medication management with supportive therapy. Risks/benefits and SE of the medication discussed. Pt verbalized understanding and verbal consent obtained for treatment.  Affirm with the patient that the medications are taken as ordered. Patient expressed understanding of how their medications were to be used.  -worsening of symptoms    Therapy: brief supportive therapy provided. Discussed psychosocial stressors in detail.   Declined therapy referral at this time  Pt denies SI and is at an acute low risk for suicide.Patient  told to call clinic if any problems occur. Patient advised to go to ER if they should develop SI/HI, side effects, or if symptoms worsen. Has crisis numbers to call if needed. Pt verbalized understanding.  F/up in 3 months months or sooner if needed   Margit Banda, MD 08/29/2014

## 2014-09-04 ENCOUNTER — Ambulatory Visit (HOSPITAL_COMMUNITY): Payer: Self-pay | Admitting: Psychiatry

## 2014-10-21 ENCOUNTER — Other Ambulatory Visit (HOSPITAL_COMMUNITY): Payer: Self-pay | Admitting: Psychiatry

## 2014-11-22 ENCOUNTER — Ambulatory Visit (INDEPENDENT_AMBULATORY_CARE_PROVIDER_SITE_OTHER): Payer: 59 | Admitting: Psychiatry

## 2014-11-22 ENCOUNTER — Encounter (HOSPITAL_COMMUNITY): Payer: Self-pay | Admitting: Psychiatry

## 2014-11-22 DIAGNOSIS — F411 Generalized anxiety disorder: Secondary | ICD-10-CM

## 2014-11-22 DIAGNOSIS — F331 Major depressive disorder, recurrent, moderate: Secondary | ICD-10-CM

## 2014-11-22 MED ORDER — ARIPIPRAZOLE 5 MG PO TABS: 5.0000 mg | ORAL_TABLET | Freq: Every day | ORAL | Status: DC

## 2014-11-22 MED ORDER — CITALOPRAM HYDROBROMIDE 40 MG PO TABS: 40.0000 mg | ORAL_TABLET | Freq: Every day | ORAL | Status: DC

## 2014-11-22 MED ORDER — HYDROXYZINE HCL 50 MG PO TABS: 50.0000 mg | ORAL_TABLET | Freq: Every day | ORAL | Status: DC

## 2014-11-22 NOTE — Progress Notes (Signed)
[facility] [id number] Progress Note  Jessica Fritz [id number] 39 y.o.  11/22/2014 5:04 PM  Chief Complaint: so/so  History of Present Illness: Pt got a dog a few months ago. She walks him twice a month. She is not sure if it helps with depression. It sometimes feels like a chore to care for the dog.   A couple of a month she feels sad mood and low energy. Denies anhedonia, worthlessness and crying spells. Reports ongoing isolation. About 3x/month pt is unable to get out of bed and has to call out of work. States she is using her FMLA on those days.   Sleep remains poor and broken. Pt is getting about 6 hrs even with Vistaril. Appetite and concentration are variable. Energy is generally low. .   Anxiety is ok. She feels anxious about 2-3x/week for a few hours.  Treat with meds or distraction. Panic attacks have decreased and she has them 3x/month. .   Pt is taking Celexa, Abilify and Vistaril as prescribed and they are helping some. Endorsing SE of fatigue. Pt ran out of Abilify one month ago and didn't refill due to cost.   Suicidal Ideation: No Plan Formed: No Patient has means to carry out plan: No  Homicidal Ideation: No Plan Formed: No Patient has means to carry out plan: No  Review of Systems: Psychiatric: Agitation: No Hallucination: No Depressed Mood: Yes Insomnia: Yes Hypersomnia: No Altered Concentration: Yes Feels Worthless: No Grandiose Ideas: No Belief In Special Powers: No New/Increased Substance Abuse: No Compulsions: No  Neurologic: Headache: Yes Seizure: No Paresthesias: No   Review of Systems  Constitutional: Positive for malaise/fatigue. Negative for fever and chills.  HENT: Negative for congestion and sore throat.   Eyes: Negative for blurred vision, double vision and redness.  Respiratory: Negative for cough, shortness of breath and wheezing.   Cardiovascular: Negative for chest pain, palpitations and leg swelling.  Gastrointestinal:  Negative for heartburn, nausea, vomiting and abdominal pain.  Musculoskeletal: Negative for myalgias, back pain, joint pain and neck pain.  Skin: Negative for itching and rash.  Neurological: Positive for headaches. Negative for dizziness, seizures and loss of consciousness.  Psychiatric/Behavioral: Positive for depression. Negative for suicidal ideas, hallucinations and substance abuse. The patient has insomnia. The patient is not nervous/anxious.      Past Medical Family, Social History: lives alone. Works at [employer].  reports that she has never smoked. She has never used smokeless tobacco. She reports that she drinks alcohol. She reports that she does not use illicit drugs.  Family History  Problem Relation Age of Onset  . Alcohol abuse Paternal Grandfather   . Anxiety disorder Neg Hx   . Bipolar disorder Neg Hx   . Depression Neg Hx   . Suicidality Neg Hx     Past Medical History  Diagnosis Date  . Hypertension   . Anxiety   . Depression   . Headache(784.0)   . Vitamin D deficiency     Outpatient Encounter Prescriptions as of 11/22/2014  Medication Sig  . ARIPiprazole (ABILIFY) 5 MG tablet Take 1 tablet (5 mg total) by mouth daily.  . Ascorbic Acid (VITAMIN C) 100 MG tablet Take 100 mg by mouth daily.  Marland Kitchen atenolol (TENORMIN) 25 MG tablet Take 25 mg by mouth daily.  . calcium carbonate (TUMS - DOSED IN MG ELEMENTAL CALCIUM) 500 MG chewable tablet Chew 2 tablets by mouth daily.    . citalopram (CELEXA) 40 MG tablet Take 1 tablet (  40 mg total) by mouth daily.  . furosemide (LASIX) 20 MG tablet Take 20 mg by mouth daily.  Marland Kitchen guaiFENesin (MUCINEX) 600 MG 12 hr tablet Take 1,200 mg by mouth 2 (two) times daily.    . hydrOXYzine (ATARAX/VISTARIL) 50 MG tablet Take 1 tablet (50 mg total) by mouth at bedtime.  . Multiple Vitamin (MULTIVITAMIN) tablet Take 1 tablet by mouth daily.    . vitamin B-12 (CYANOCOBALAMIN) 100 MCG tablet Take 100 mcg by mouth daily.  . Vitamin D,  Ergocalciferol, (DRISDOL) 50000 UNITS CAPS capsule Take 50,000 Units by mouth every 7 (seven) days.   No facility-administered encounter medications on file as of 11/22/2014.    Past Psychiatric History/Hospitalization(s): Anxiety: Yes Bipolar Disorder: No Depression: Yes Mania: No Psychosis: No Schizophrenia: No Personality Disorder: No Hospitalization for psychiatric illness: No History of Electroconvulsive Shock Therapy: No Prior Suicide Attempts: No  Physical Exam: Constitutional:  There were no vitals taken for this visit.  General Appearance: alert, oriented, no acute distress  Musculoskeletal: Strength & Muscle Tone: within normal limits Gait & Station: normal Patient leans: N/A  Mental Status Examination/Evaluation: Objective: Attitude: Calm and cooperative  Appearance: Fairly Groomed, appears to be stated age  Patent attorney::  Fair  Speech:  Clear and Coherent and Normal Rate  Volume:  Normal  Mood:  depressed  Affect:  Restricted  Thought Process:  Goal Directed, Linear and Logical  Orientation:  Full (Time, Place, and Person)  Thought Content:  Negative  Suicidal Thoughts:  No  Homicidal Thoughts:  No  Judgement:  Good  Insight:  Good  Concentration: good  Memory: Immediate-good Recent-good Remote-good  Recall: fair  Language: fair  Gait and Station: normal  [employer] of Knowledge: average  Psychomotor Activity:  Decreased  Akathisia:  No  Handed:  Right  AIMS (if indicated):  Facial and Oral Movements  Muscles of Facial Expression: None, normal  Lips and Perioral Area: None, normal  Jaw: None, normal  Tongue: None, normal Extremity Movements: Upper (arms, wrists, hands, fingers): None, normal  Lower (legs, knees, ankles, toes): None, normal,  Trunk Movements:  Neck, shoulders, hips: None, normal,  Overall Severity : Severity of abnormal movements (highest score from questions above): None, normal  Incapacitation due to abnormal movements:  None, normal  Patient's awareness of abnormal movements (rate only patient's report): No Awareness, Dental Status  Current problems with teeth and/or dentures?: No  Does patient usually wear dentures?: No    Assets:  Communication Skills Desire for Improvement Financial Resources/Insurance Housing Leisure Time Physical Health Resilience Social Support Talents/Skills       Medical Decision Making (Choose Three): Review of Psycho-Social Stressors (1), Established Problem, Worsening (2), Review of Medication Regimen & Side Effects (2) and Review of New Medication or Change in Dosage (2)  Assessment: Axis I: MDD- recurrent, moderate; GAD  Axis II: deferred  Axis III:  Past Medical History  Diagnosis Date  . Hypertension   . Anxiety   . Depression   . Headache(784.0)   . Vitamin D deficiency    Axis IV: limited coping skills  Axis V: GAF 51-60   Plan: increase Abilify  po qHS for mood augmenation Celexa   po qHS to target mood and anxiety Vistaril  at bedtime prn insomnia  Medication management with supportive therapy. Risks/benefits and SE of the medication discussed. Pt verbalized understanding and verbal consent obtained for treatment.  Affirm with the patient that the medications are taken as ordered. Patient expressed understanding  of how their medications were to be used.  -worsening of symptoms  Labs- pt will bring copy of recent labs and EKG for review  Therapy: brief supportive therapy provided. Discussed psychosocial stressors in detail.   Declined therapy referral at this time  Pt denies SI and is at an acute low risk for suicide.Patient told to call clinic if any problems occur. Patient advised to go to ER if they should develop SI/HI, side effects, or if symptoms worsen. Has crisis numbers to call if needed. Pt verbalized understanding.  F/up in 2 months or sooner if needed. Pt states she needs FMLA to be done again and will fax in copy.     Oletta Darter, MD 11/22/2014

## 2014-11-27 ENCOUNTER — Telehealth (HOSPITAL_COMMUNITY): Payer: Self-pay

## 2014-11-27 DIAGNOSIS — F331 Major depressive disorder, recurrent, moderate: Secondary | ICD-10-CM

## 2014-11-27 MED ORDER — ABILIFY 5 MG PO TABS: 5.0000 mg | ORAL_TABLET | Freq: Every day | ORAL | Status: DC

## 2014-11-27 NOTE — Telephone Encounter (Signed)
New name brand order for patient's requested one time Abilify order e-scribed to patient's [facility] and called their pharmacy to verify order was received.  Left patient a message the one time order was e-scribed as she had requested.

## 2014-11-27 NOTE — Telephone Encounter (Signed)
Yes we can do order for brand name Abilify

## 2014-11-27 NOTE — Telephone Encounter (Signed)
Medication management - Telephone call with patient after she had left a message stating need for a new Abilfiy order and wanted to be able to get generic.  Informed pt. Dr. Michae Kava  had e-scribed a new order + 1 refill on 11/22/14 to [facility].   Agreed to call [facility] to follow up as order had been e-scribed 11/22/14.  Contacted Shanda Bumps, pharmacist at [facility] who reported reported the issue was that their current order was for generic Abilify but patient has a coupon for name brand and needs a one time order specifying name brand so can use coupon.

## 2014-11-29 ENCOUNTER — Ambulatory Visit (HOSPITAL_COMMUNITY): Payer: Self-pay | Admitting: Psychiatry

## 2014-12-07 ENCOUNTER — Telehealth (HOSPITAL_COMMUNITY): Payer: Self-pay

## 2014-12-12 ENCOUNTER — Telehealth (HOSPITAL_COMMUNITY): Payer: Self-pay

## 2014-12-12 NOTE — Telephone Encounter (Signed)
Medication management - left a message for patient to call us back or to come by our office to sign consent so we could fax in her needed FMLA paperwork.

## 2014-12-12 NOTE — Telephone Encounter (Signed)
Telephone message left for patient this nurse received her call back that she was out of town and would come by to sign consent for FMLA paperwork to be sent in for her on 12/17/14 after she returns.  Had left 6 messages for patient until she called to inform us she was out of town.  Paperwork to be faxed in to [place] once consent is received.

## 2014-12-21 ENCOUNTER — Telehealth (HOSPITAL_COMMUNITY): Payer: Self-pay

## 2014-12-21 NOTE — Telephone Encounter (Signed)
Medication management - Telephone call message left for patient this nurse received her message of needed refill for Abilify and informed would send request to Dr. Michae KavaAgarwal who would be back in the office on 12/25/14.  Medication last order 11/27/14.

## 2014-12-25 ENCOUNTER — Other Ambulatory Visit (HOSPITAL_COMMUNITY): Payer: Self-pay | Admitting: Psychiatry

## 2014-12-25 DIAGNOSIS — F331 Major depressive disorder, recurrent, moderate: Secondary | ICD-10-CM

## 2014-12-25 MED ORDER — ARIPIPRAZOLE 5 MG PO TABS: 5.0000 mg | ORAL_TABLET | Freq: Every day | ORAL | Status: DC

## 2015-01-01 ENCOUNTER — Other Ambulatory Visit (HOSPITAL_COMMUNITY): Payer: Self-pay | Admitting: Psychiatry

## 2015-01-01 DIAGNOSIS — F411 Generalized anxiety disorder: Secondary | ICD-10-CM

## 2015-01-01 DIAGNOSIS — F331 Major depressive disorder, recurrent, moderate: Secondary | ICD-10-CM

## 2015-01-01 MED ORDER — CITALOPRAM HYDROBROMIDE 40 MG PO TABS: 40.0000 mg | ORAL_TABLET | Freq: Every day | ORAL | Status: DC

## 2015-01-08 ENCOUNTER — Telehealth (HOSPITAL_COMMUNITY): Payer: Self-pay

## 2015-01-08 DIAGNOSIS — F411 Generalized anxiety disorder: Secondary | ICD-10-CM

## 2015-01-08 DIAGNOSIS — F331 Major depressive disorder, recurrent, moderate: Secondary | ICD-10-CM

## 2015-01-08 MED ORDER — CITALOPRAM HYDROBROMIDE 40 MG PO TABS: 40.0000 mg | ORAL_TABLET | Freq: Every day | ORAL | Status: DC

## 2015-01-08 NOTE — Telephone Encounter (Signed)
Met with Dr. Doyne Keel to discuss patient's messages left wanting to change her Hydroxyzine and Celexa to 90 day mail orders.  Patient returns on 01/24/15 at 4:15pm with Dr. Doyne Keel so agreed to send in a 15 day supply of patient's prescribed Celexa to her local [facility] and left patient a message, per instruction of Dr. Doyne Keel, she would work with patient to change prescriptions over to 90 day supplies when she returns to see Dr. Doyne Keel on 01/24/15 so she can get orders running out on time together.  New 15 day order for patient's Celexa e-scribed to patient's [facility] as instructed by Dr. Doyne Keel to last patient until she returns on 01/24/15.

## 2015-01-24 ENCOUNTER — Encounter (HOSPITAL_COMMUNITY): Payer: Self-pay | Admitting: Psychiatry

## 2015-01-24 ENCOUNTER — Ambulatory Visit (INDEPENDENT_AMBULATORY_CARE_PROVIDER_SITE_OTHER): Payer: 59 | Admitting: Psychiatry

## 2015-01-24 VITALS — BP 140/80 | HR 64 | Resp 12 | Ht 66.0 in | Wt 296.8 lb

## 2015-01-24 DIAGNOSIS — F411 Generalized anxiety disorder: Secondary | ICD-10-CM | POA: Diagnosis not present

## 2015-01-24 DIAGNOSIS — G4700 Insomnia, unspecified: Secondary | ICD-10-CM | POA: Diagnosis not present

## 2015-01-24 DIAGNOSIS — F331 Major depressive disorder, recurrent, moderate: Secondary | ICD-10-CM | POA: Diagnosis not present

## 2015-01-24 MED ORDER — HYDROXYZINE HCL 50 MG PO TABS: 50.0000 mg | ORAL_TABLET | Freq: Every day | ORAL | Status: DC

## 2015-01-24 MED ORDER — CITALOPRAM HYDROBROMIDE 40 MG PO TABS: 40.0000 mg | ORAL_TABLET | Freq: Every day | ORAL | Status: DC

## 2015-01-24 MED ORDER — LITHIUM CARBONATE ER 300 MG PO TBCR: 300.0000 mg | EXTENDED_RELEASE_TABLET | Freq: Two times a day (BID) | ORAL | Status: DC

## 2015-01-24 NOTE — Progress Notes (Signed)
Patient ID: Jessica Fritz, female   DOB: 03-18-1975, 39 y.o.   MRN: [id number]  [facility] [id number] Progress Note  Jessica Fritz [id number] 39 y.o.  01/24/2015 4:26 PM  Chief Complaint: so/so  History of Present Illness: Pt is more attached to her dog now. Her parents are visiting and it is stressful for the pt.   A couple of times a month she feels sad mood. Most days she has low energy and low motivation especially for work. Denies worthlessness and crying spells. Reports ongoing isolation and anhedonia.  About 3x/month pt is unable to get out of bed and has to call out of work. States she is using her FMLA on those days.   Sleep remains poor and broken. Pt is getting about 6 hrs even with Vistaril. Pt is snoring, wakes up coughing and gasping for air. She has never been evaluated for sleep apnea. Appetite is increased and she eats when she is bored.  concentration is poor.    Anxiety is present. She feels panic attacks about 2-4x/month for a few hours.  Treat with meds or distraction.    Pt is taking Celexa, Abilify and Vistaril as prescribed and they are helping some. Endorsing SE of fatigue. Reports depression improved a little with increased dose of Abilify. She has trouble getting the Abilify filled every month due to the cost.   Suicidal Ideation: No Plan Formed: No Patient has means to carry out plan: No  Homicidal Ideation: No Plan Formed: No Patient has means to carry out plan: No  Review of Systems: Psychiatric: Agitation: No Hallucination: No Depressed Mood: Yes Insomnia: Yes Hypersomnia: Yes Altered Concentration: Yes Feels Worthless: No Grandiose Ideas: No Belief In Special Powers: No New/Increased Substance Abuse: No Compulsions: No  Neurologic: Headache: Yes Seizure: No Paresthesias: No   Review of Systems  Constitutional: Positive for malaise/fatigue. Negative for fever and chills.  HENT: Negative for congestion and sore throat.   Eyes:  Negative for blurred vision, double vision and redness.  Respiratory: Negative for cough, shortness of breath and wheezing.   Cardiovascular: Negative for chest pain, palpitations and leg swelling.  Gastrointestinal: Negative for heartburn, nausea, vomiting and abdominal pain.  Musculoskeletal: Positive for back pain. Negative for myalgias, joint pain and neck pain.  Skin: Negative for itching and rash.  Neurological: Positive for headaches. Negative for dizziness, tremors, seizures and loss of consciousness.  Psychiatric/Behavioral: Positive for depression. Negative for suicidal ideas, hallucinations and substance abuse. The patient is nervous/anxious and has insomnia.      Past Medical Family, Social History: lives alone. Works at [employer].  reports that she has never smoked. She has never used smokeless tobacco. She reports that she drinks alcohol. She reports that she does not use illicit drugs.  Family History  Problem Relation Age of Onset  . Alcohol abuse Paternal Grandfather   . Anxiety disorder Neg Hx   . Bipolar disorder Neg Hx   . Depression Neg Hx   . Suicidality Neg Hx     Past Medical History  Diagnosis Date  . Hypertension   . Anxiety   . Depression   . Headache(784.0)   . Vitamin D deficiency     Outpatient Encounter Prescriptions as of 01/24/2015  Medication Sig  . ARIPiprazole (ABILIFY) 5 MG tablet Take 1 tablet (5 mg total) by mouth daily.  . Ascorbic Acid (VITAMIN C) 100 MG tablet Take 100 mg by mouth daily.  Marland Kitchen atenolol (TENORMIN) 25 MG tablet Take  25 mg by mouth daily.  . calcium carbonate (TUMS - DOSED IN MG ELEMENTAL CALCIUM) 500 MG chewable tablet Chew 2 tablets by mouth daily.    . citalopram (CELEXA) 40 MG tablet Take 1 tablet (40 mg total) by mouth daily.  . furosemide (LASIX) 20 MG tablet Take 20 mg by mouth daily.  Marland Kitchen guaiFENesin (MUCINEX) 600 MG 12 hr tablet Take 1,200 mg by mouth 2 (two) times daily.    . hydrOXYzine (ATARAX/VISTARIL) 50  MG tablet Take 1 tablet (50 mg total) by mouth at bedtime.  . Multiple Vitamin (MULTIVITAMIN) tablet Take 1 tablet by mouth daily.    . vitamin B-12 (CYANOCOBALAMIN) 100 MCG tablet Take 100 mcg by mouth daily.  . Vitamin D, Ergocalciferol, (DRISDOL) 50000 UNITS CAPS capsule Take 50,000 Units by mouth every 7 (seven) days.   No facility-administered encounter medications on file as of 01/24/2015.    Past Psychiatric History/Hospitalization(s): Anxiety: Yes Bipolar Disorder: No Depression: Yes Mania: No Psychosis: No Schizophrenia: No Personality Disorder: No Hospitalization for psychiatric illness: No History of Electroconvulsive Shock Therapy: No Prior Suicide Attempts: No  Physical Exam: Constitutional:  BP 150/70 mmHg  Pulse 64  Resp 12  Ht  (1.676 m)  Wt 296 lb 12.8 oz (134.628 kg)  BMI 47.93 kg/m2  General Appearance: alert, oriented, no acute distress  Musculoskeletal: Strength & Muscle Tone: within normal limits Gait & Station: normal Patient leans: N/A  Mental Status Examination/Evaluation: Objective: Attitude: Calm and cooperative  Appearance: Fairly Groomed, appears to be stated age  Patent attorney::  Fair  Speech:  Clear and Coherent and Normal Rate  Volume:  Normal  Mood:  depressed  Affect:  Restricted  Thought Process:  Goal Directed, Linear and Logical  Orientation:  Full (Time, Place, and Person)  Thought Content:  Negative  Suicidal Thoughts:  No  Homicidal Thoughts:  No  Judgement:  Good  Insight:  Good  Concentration: good  Memory: Immediate-good Recent-good Remote-good  Recall: fair  Language: fair  Gait and Station: normal  [employer] of Knowledge: average  Psychomotor Activity:  Normal  Akathisia:  No  Handed:  Right  AIMS (if indicated):  Facial and Oral Movements  Muscles of Facial Expression: None, normal  Lips and Perioral Area: None, normal  Jaw: None, normal  Tongue: None, normal Extremity Movements: Upper (arms,  wrists, hands, fingers): None, normal  Lower (legs, knees, ankles, toes): None, normal,  Trunk Movements:  Neck, shoulders, hips: None, normal,  Overall Severity : Severity of abnormal movements (highest score from questions above): None, normal  Incapacitation due to abnormal movements: None, normal  Patient's awareness of abnormal movements (rate only patient's report): No Awareness, Dental Status  Current problems with teeth and/or dentures?: No  Does patient usually wear dentures?: No    Assets:  Communication Skills Desire for Improvement Financial Resources/Insurance Housing Leisure Time Physical Health Resilience Social Support Talents/Skills       Medical Decision Making (Choose Three): Review of Psycho-Social Stressors (1), Established Problem, Worsening (2), Review of Medication Regimen & Side Effects (2) and Review of New Medication or Change in Dosage (2)  Assessment: Axis I: MDD- recurrent, moderate; GAD; Insomnia  Axis II: deferred  Axis III:  Past Medical History  Diagnosis Date  . Hypertension   . Anxiety   . Depression   . Headache(784.0)   . Vitamin D deficiency    Axis IV: limited coping skills  Axis V: GAF 51-60   Plan: d/c Abilify due  to cost Start trial of Lithobid 300mg  po qHS for mood augmenation Celexa 40mg  po qHS to target mood and anxiety Vistaril 25mg  at bedtime prn insomnia  Medication management with supportive therapy. Risks/benefits and SE of the medication discussed. Pt verbalized understanding and verbal consent obtained for treatment.  Affirm with the patient that the medications are taken as ordered. Patient expressed understanding of how their medications were to be used.  -worsening of symptoms  Labs- pt will bring copy of recent labs and EKG for review  Therapy: brief supportive therapy provided. Discussed psychosocial stressors in detail.   Declined therapy referral at this time  Consultations- refer for sleep apnea  evaluation  Pt denies SI and is at an acute low risk for suicide.Patient told to call clinic if any problems occur. Patient advised to go to ER if they should develop SI/HI, side effects, or if symptoms worsen. Has crisis numbers to call if needed. Pt verbalized understanding.  F/up in 2 months or sooner if needed. Pt states she needs FMLA to be done again and will fax in copy.    Oletta DarterAGARWAL, Colsen Modi, MD 01/24/2015

## 2015-01-29 ENCOUNTER — Telehealth (HOSPITAL_COMMUNITY): Payer: Self-pay

## 2015-01-29 DIAGNOSIS — G4733 Obstructive sleep apnea (adult) (pediatric): Secondary | ICD-10-CM

## 2015-01-29 NOTE — Telephone Encounter (Signed)
Medication management - Called to set patient up for a sleep study per request of Dr. Michae KavaAgarwal due to suspected Sleep Apnea.  [facility] [phone] to provide patient information and to input order for suspected sleep apnea evaluation.  Terri reported she would call patient to arrange time for her to come in for the sleep study.

## 2015-02-07 ENCOUNTER — Telehealth (HOSPITAL_COMMUNITY): Payer: Self-pay

## 2015-02-07 DIAGNOSIS — F411 Generalized anxiety disorder: Secondary | ICD-10-CM

## 2015-02-07 DIAGNOSIS — F331 Major depressive disorder, recurrent, moderate: Secondary | ICD-10-CM

## 2015-02-07 NOTE — Telephone Encounter (Signed)
Medication fax refill request. - Faxed received from [facility] requesting new orders for patient's Hydroxyzine and Citalopram be issued to them for patient as a transfer of pharmaceutical services.

## 2015-02-12 MED ORDER — HYDROXYZINE HCL 50 MG PO TABS: 50.0000 mg | ORAL_TABLET | Freq: Every day | ORAL | Status: DC

## 2015-02-12 MED ORDER — CITALOPRAM HYDROBROMIDE 40 MG PO TABS: 40.0000 mg | ORAL_TABLET | Freq: Every day | ORAL | Status: DC

## 2015-02-12 NOTE — Telephone Encounter (Signed)
90days

## 2015-02-12 NOTE — Telephone Encounter (Signed)
New 90 day orders for patient's Hydroxyzine and Celexa e-scribed to patient's [facility] as requested and approved by Dr. Michae KavaAgarwal for home delivery.

## 2015-03-28 ENCOUNTER — Ambulatory Visit (HOSPITAL_COMMUNITY): Payer: Self-pay | Admitting: Psychiatry

## 2015-04-12 ENCOUNTER — Ambulatory Visit (HOSPITAL_BASED_OUTPATIENT_CLINIC_OR_DEPARTMENT_OTHER): Payer: Self-pay

## 2015-04-17 ENCOUNTER — Other Ambulatory Visit (HOSPITAL_COMMUNITY): Payer: Self-pay | Admitting: Psychiatry

## 2015-05-02 ENCOUNTER — Ambulatory Visit (HOSPITAL_COMMUNITY): Payer: Self-pay | Admitting: Psychiatry

## 2015-05-13 ENCOUNTER — Other Ambulatory Visit (HOSPITAL_COMMUNITY): Payer: Self-pay | Admitting: Psychiatry

## 2015-05-14 ENCOUNTER — Ambulatory Visit (INDEPENDENT_AMBULATORY_CARE_PROVIDER_SITE_OTHER): Payer: 59 | Admitting: Psychiatry

## 2015-05-14 ENCOUNTER — Encounter (HOSPITAL_COMMUNITY): Payer: Self-pay | Admitting: Psychiatry

## 2015-05-14 VITALS — BP 138/84 | HR 80 | Ht 66.0 in | Wt 294.8 lb

## 2015-05-14 DIAGNOSIS — G4700 Insomnia, unspecified: Secondary | ICD-10-CM

## 2015-05-14 DIAGNOSIS — F331 Major depressive disorder, recurrent, moderate: Secondary | ICD-10-CM | POA: Diagnosis not present

## 2015-05-14 DIAGNOSIS — F411 Generalized anxiety disorder: Secondary | ICD-10-CM

## 2015-05-14 MED ORDER — HYDROXYZINE HCL 50 MG PO TABS: 50.0000 mg | ORAL_TABLET | Freq: Every day | ORAL | Status: DC

## 2015-05-14 MED ORDER — LITHOBID 300 MG PO TBCR: 300.0000 mg | EXTENDED_RELEASE_TABLET | Freq: Two times a day (BID) | ORAL | Status: DC

## 2015-05-14 MED ORDER — CITALOPRAM HYDROBROMIDE 40 MG PO TABS: 40.0000 mg | ORAL_TABLET | Freq: Every day | ORAL | Status: DC

## 2015-05-14 NOTE — Progress Notes (Signed)
Patient ID: Jessica MalayMichelle Mckinstry, female   DOB: 1975/10/30, 40 y.o.   MRN: [id number] Patient ID: Jessica MalayMichelle Apple, female   DOB: 1975/10/30, 40 y.o.   MRN: [id number]  [facility] [id number] Progress Note  Jessica Fritz [id number] 40 y.o.  05/14/2015 4:36 PM  Chief Complaint: not good  History of Present Illness: Pt is unemployed as of last week.   Depression is worse.  Most days she has low energy and low motivation and she is not doing anything but lying around. Reports worthlessness and crying spells. Reports ongoing isolation and anhedonia.    Sleep remains poor and broken. Pt is getting about 6 hrs even with Vistaril. Pt is snoring, wakes up coughing and gasping for air. She has never been evaluated for sleep apnea. Appetite is variable and she eats when she is bored.  Concentration is poor.  Energy is low.   Anxiety is increased due to recent loss of job. She feels panic attacks about 2-4x/week for a few hours due to recent stressors.  Treat with meds or distraction.    Pt is taking Celexa, Lithium and Vistaril as prescribed and they are helping some. Endorsing SE of fatigue.    Suicidal Ideation: No Plan Formed: No Patient has means to carry out plan: No  Homicidal Ideation: No Plan Formed: No Patient has means to carry out plan: No  Review of Systems: Psychiatric: Agitation: No Hallucination: No Depressed Mood: Yes Insomnia: Yes Hypersomnia: Yes Altered Concentration: Yes Feels Worthless: Yes Grandiose Ideas: No Belief In Special Powers: No New/Increased Substance Abuse: No Compulsions: No    Review of Systems  Constitutional: Positive for malaise/fatigue. Negative for fever and chills.  HENT: Negative for congestion and sore throat.   Eyes: Negative for blurred vision, double vision and redness.  Respiratory: Negative for cough, shortness of breath and wheezing.   Cardiovascular: Positive for palpitations. Negative for chest pain and leg swelling.   Gastrointestinal: Negative for heartburn, nausea, vomiting and abdominal pain.  Musculoskeletal: Negative for myalgias, back pain, joint pain and neck pain.  Skin: Negative for itching and rash.  Neurological: Positive for headaches. Negative for dizziness, tremors, seizures and loss of consciousness.  Psychiatric/Behavioral: Positive for depression. Negative for suicidal ideas, hallucinations and substance abuse. The patient is nervous/anxious and has insomnia.      Past Medical Family, Social History: lives alone. Currently unemployed   reports that she has never smoked. She has never used smokeless tobacco. She reports that she drinks alcohol. She reports that she does not use illicit drugs.  Family History  Problem Relation Age of Onset  . Alcohol abuse Paternal Grandfather   . Anxiety disorder Neg Hx   . Bipolar disorder Neg Hx   . Depression Neg Hx   . Suicidality Neg Hx     Past Medical History  Diagnosis Date  . Hypertension   . Anxiety   . Depression   . Headache(784.0)   . Vitamin D deficiency     Outpatient Encounter Prescriptions as of 05/14/2015  Medication Sig  . Ascorbic Acid (VITAMIN C) 100 MG tablet Take 100 mg by mouth daily.  Marland Kitchen. atenolol (TENORMIN) 25 MG tablet Take 25 mg by mouth daily.  . calcium carbonate (TUMS - DOSED IN MG ELEMENTAL CALCIUM) 500 MG chewable tablet Chew 2 tablets by mouth daily.    . citalopram (CELEXA) 40 MG tablet Take 1 tablet (40 mg total) by mouth daily.  . furosemide (LASIX) 20 MG tablet Take 20 mg by mouth  daily. Reported on 05/14/2015  . hydrOXYzine (ATARAX/VISTARIL) 50 MG tablet Take 1 tablet (50 mg total) by mouth at bedtime.  Marland Kitchen lithium carbonate (LITHOBID) 300 MG CR tablet Take 1 tablet (300 mg total) by mouth 2 (two) times daily.  . Multiple Vitamin (MULTIVITAMIN) tablet Take 1 tablet by mouth daily.    . vitamin B-12 (CYANOCOBALAMIN) 100 MCG tablet Take 100 mcg by mouth daily.  . Vitamin D, Ergocalciferol, (DRISDOL) 50000  UNITS CAPS capsule Take 50,000 Units by mouth every 7 (seven) days.  Marland Kitchen guaiFENesin (MUCINEX) 600 MG 12 hr tablet Take 1,200 mg by mouth 2 (two) times daily. Reported on 05/14/2015   No facility-administered encounter medications on file as of 05/14/2015.    Past Psychiatric History/Hospitalization(s): Anxiety: Yes Bipolar Disorder: No Depression: Yes Mania: No Psychosis: No Schizophrenia: No Personality Disorder: No Hospitalization for psychiatric illness: No History of Electroconvulsive Shock Therapy: No Prior Suicide Attempts: No  Physical Exam: Constitutional:  BP 138/84 mmHg  Pulse 80  Ht  (1.676 m)  Wt 294 lb 12.8 oz (133.72 kg)  BMI 47.60 kg/m2  General Appearance: alert, oriented, no acute distress  Musculoskeletal: Strength & Muscle Tone: within normal limits Gait & Station: normal Patient leans: N/A and straight  Mental Status Examination/Evaluation: Objective: Attitude: Calm and cooperative  Appearance: Fairly Groomed, appears to be stated age  Patent attorney::  Fair  Speech:  Clear and Coherent and Normal Rate  Volume:  Normal  Mood:  depressed  Affect:  Restricted  Thought Process:  Goal Directed, Linear and Logical  Orientation:  Full (Time, Place, and Person)  Thought Content:  Negative  Suicidal Thoughts:  No  Homicidal Thoughts:  No  Judgement:  Good  Insight:  Good  Concentration: good  Memory: Immediate-good Recent-good Remote-good  Recall: fair  Language: fair  Gait and Station: normal  [employer] of Knowledge: average  Psychomotor Activity:  Normal  Akathisia:  No  Handed:  Right  AIMS (if indicated):  Facial and Oral Movements  Muscles of Facial Expression: None, normal  Lips and Perioral Area: None, normal  Jaw: None, normal  Tongue: None, normal Extremity Movements: Upper (arms, wrists, hands, fingers): None, normal  Lower (legs, knees, ankles, toes): None, normal,  Trunk Movements:  Neck, shoulders, hips: None, normal,   Overall Severity : Severity of abnormal movements (highest score from questions above): None, normal  Incapacitation due to abnormal movements: None, normal  Patient's awareness of abnormal movements (rate only patient's report): No Awareness, Dental Status  Current problems with teeth and/or dentures?: No  Does patient usually wear dentures?: No    Assets:  Communication Skills Desire for Improvement Financial Resources/Insurance Housing Leisure Time Physical Health Resilience Social Support Talents/Skills       Medical Decision Making (Choose Three): Review of Psycho-Social Stressors (1), Established Problem, Worsening (2), Review of Medication Regimen & Side Effects (2) and Review of New Medication or Change in Dosage (2)  Assessment: Axis I: MDD- recurrent, moderate; GAD; Insomnia  Axis II: deferred     Plan:  Increase Lithobid  po BID for mood augmenation Celexa  po qHS to target mood and anxiety Vistaril  at bedtime prn insomnia  Medication management with supportive therapy. Risks/benefits and SE of the medication discussed. Pt verbalized understanding and verbal consent obtained for treatment.  Affirm with the patient that the medications are taken as ordered. Patient expressed understanding of how their medications were to be used.  -worsening of symptoms  Labs- reviewed labs  from PCP on 04/17/2015 - Lipids WNL, HbA1c 5.8, glu 101, 04/30/2015 Hb 11.9; TSH WNL   Therapy: brief supportive therapy provided. Discussed psychosocial stressors in detail.   Declined therapy referral at this time  Consultations- refer for sleep apnea evaluation  Pt denies SI and is at an acute low risk for suicide.Patient told to call clinic if any problems occur. Patient advised to go to ER if they should develop SI/HI, side effects, or if symptoms worsen. Has crisis numbers to call if needed. Pt verbalized understanding.  F/up in 2 months or sooner if needed.   Oletta Darter, MD 05/14/2015

## 2015-05-16 ENCOUNTER — Other Ambulatory Visit (HOSPITAL_COMMUNITY): Payer: Self-pay

## 2015-05-16 DIAGNOSIS — F331 Major depressive disorder, recurrent, moderate: Secondary | ICD-10-CM

## 2015-05-16 MED ORDER — LITHIUM CARBONATE ER 300 MG PO TBCR: 300.0000 mg | EXTENDED_RELEASE_TABLET | Freq: Two times a day (BID) | ORAL | Status: DC

## 2015-06-21 ENCOUNTER — Encounter (HOSPITAL_BASED_OUTPATIENT_CLINIC_OR_DEPARTMENT_OTHER): Payer: Self-pay

## 2015-07-18 ENCOUNTER — Ambulatory Visit (HOSPITAL_COMMUNITY): Payer: Self-pay | Admitting: Psychiatry

## 2016-03-03 DIAGNOSIS — R5383 Other fatigue: Secondary | ICD-10-CM | POA: Diagnosis not present

## 2016-03-03 DIAGNOSIS — J208 Acute bronchitis due to other specified organisms: Secondary | ICD-10-CM | POA: Diagnosis not present

## 2016-03-03 DIAGNOSIS — I10 Essential (primary) hypertension: Secondary | ICD-10-CM | POA: Diagnosis not present

## 2016-04-23 DIAGNOSIS — I10 Essential (primary) hypertension: Secondary | ICD-10-CM | POA: Diagnosis not present

## 2016-04-23 DIAGNOSIS — R42 Dizziness and giddiness: Secondary | ICD-10-CM | POA: Diagnosis not present

## 2016-04-30 ENCOUNTER — Ambulatory Visit (INDEPENDENT_AMBULATORY_CARE_PROVIDER_SITE_OTHER): Payer: BLUE CROSS/BLUE SHIELD | Admitting: Psychiatry

## 2016-04-30 ENCOUNTER — Encounter (HOSPITAL_COMMUNITY): Payer: Self-pay | Admitting: Psychiatry

## 2016-04-30 ENCOUNTER — Other Ambulatory Visit (HOSPITAL_COMMUNITY): Payer: Self-pay

## 2016-04-30 DIAGNOSIS — F99 Mental disorder, not otherwise specified: Secondary | ICD-10-CM | POA: Diagnosis not present

## 2016-04-30 DIAGNOSIS — F331 Major depressive disorder, recurrent, moderate: Secondary | ICD-10-CM | POA: Diagnosis not present

## 2016-04-30 DIAGNOSIS — Z811 Family history of alcohol abuse and dependence: Secondary | ICD-10-CM

## 2016-04-30 DIAGNOSIS — F411 Generalized anxiety disorder: Secondary | ICD-10-CM

## 2016-04-30 DIAGNOSIS — F5105 Insomnia due to other mental disorder: Secondary | ICD-10-CM

## 2016-04-30 DIAGNOSIS — G4700 Insomnia, unspecified: Secondary | ICD-10-CM

## 2016-04-30 DIAGNOSIS — Z79899 Other long term (current) drug therapy: Secondary | ICD-10-CM | POA: Diagnosis not present

## 2016-04-30 MED ORDER — HYDROXYZINE HCL 50 MG PO TABS: 50.0000 mg | ORAL_TABLET | Freq: Every day | ORAL | 1 refills | Status: DC

## 2016-04-30 MED ORDER — BUSPIRONE HCL 10 MG PO TABS: 10.0000 mg | ORAL_TABLET | Freq: Two times a day (BID) | ORAL | 1 refills | Status: DC

## 2016-04-30 MED ORDER — CITALOPRAM HYDROBROMIDE 40 MG PO TABS: 40.0000 mg | ORAL_TABLET | Freq: Every day | ORAL | 1 refills | Status: DC

## 2016-04-30 NOTE — Progress Notes (Signed)
Patient ID: Jessica Fritz, female   DOB: 10-21-1975, 41 y.o.   MRN: [id number] Patient ID: Jessica Fritz, female   DOB: 10-21-1975, 41 y.o.   MRN: [id number]  [facility] [phone] Progress Note  Jessica MalayMichelle Afshar [id number] 41 y.o.  04/30/2016 9:26 AM  Chief Complaint: last time was a year ago  History of Present Illness: Pt is now employed at [employer] [place]. She was unemployed for several months Jessica as a result was not able to follow up.   Depression is "still there". She has 2-3 bad days a week. She wants to stay in bed all day but forces herself to get out of the house Jessica go to work. She feels unmotivated, sad Jessica wants to be left alone.   Sleep is usually poor. It takes her about 2 hrs to fall asleep Jessica she will sleep for 4-5 hrs/night. States Vistaril helps some. Pt will then take an 1.5 hrs nap. Energy is low Jessica she is tired thru out the day. Appetite is variable.   Anxiety is up randomly. At times she feels like she might be having a panic attack. Pt takes Vistaril 2-3x/week. It calms her down so she can think and do her work.    Pt is taking Celexa Jessica Vistaril as prescribed Jessica they are helping some. Medication causes fatigue.   Suicidal Ideation: No Plan Formed: No Patient has means to carry out plan: No  Homicidal Ideation: No Plan Formed: No Patient has means to carry out plan: No  Review of Systems: Psychiatric: Agitation: No Hallucination: No Depressed Mood: Yes Insomnia: Yes Hypersomnia: Yes Altered Concentration: Yes Feels Worthless: Yes Grandiose Ideas: No Belief In Special Powers: No New/Increased Substance Abuse: No Compulsions: No    Review of Systems  Constitutional: Positive for chills. Negative for fever Jessica malaise/fatigue.  Respiratory: Positive for cough Jessica sputum production. Negative for shortness of breath Jessica wheezing.   Neurological: Positive for dizziness Jessica headaches. Negative for tremors, sensory change, seizures, loss of  consciousness Jessica weakness.  Psychiatric/Behavioral: Positive for depression. Negative for hallucinations, substance abuse Jessica suicidal ideas. The patient is nervous/anxious Jessica has insomnia.      Past Medical Family, Social History: lives alone with her 2 dogs. Currently employed at [employer].  reports that she has never smoked. She has never used smokeless tobacco. She reports that she drinks alcohol. She reports that she does not use drugs.  Family History  Problem Relation Age of Onset  . Alcohol abuse Paternal Grandfather   . Anxiety disorder Neg Hx   . Bipolar disorder Neg Hx   . Depression Neg Hx   . Suicidality Neg Hx     Past Medical History:  Diagnosis Date  . Anxiety   . Depression   . Headache(784.0)   . Hypertension   . Vitamin D deficiency     Outpatient Encounter Prescriptions as of 04/30/2016  Medication Sig  . Ascorbic Acid (VITAMIN C) 100 MG tablet Take 100 mg by mouth daily.  Marland Kitchen. atenolol (TENORMIN) 25 MG tablet Take 25 mg by mouth daily.  . calcium carbonate (TUMS - DOSED IN MG ELEMENTAL CALCIUM) 500 MG chewable tablet Chew 2 tablets by mouth daily.    . citalopram (CELEXA) 40 MG tablet Take 1 tablet (40 mg total) by mouth daily.  . furosemide (LASIX) 20 MG tablet Take 20 mg by mouth daily. Reported on 05/14/2015  . guaiFENesin (MUCINEX) 600 MG 12 hr tablet Take 1,200 mg by mouth 2 (  two) times daily. Reported on 05/14/2015  . hydrOXYzine (ATARAX/VISTARIL) 50 MG tablet Take 1 tablet (50 mg total) by mouth at bedtime.  . Multiple Vitamin (MULTIVITAMIN) tablet Take 1 tablet by mouth daily.    . vitamin B-12 (CYANOCOBALAMIN) 100 MCG tablet Take 100 mcg by mouth daily.  . Vitamin D, Ergocalciferol, (DRISDOL) 50000 UNITS CAPS capsule Take 50,000 Units by mouth every 7 (seven) days.  Marland Kitchen lithium carbonate (LITHOBID) 300 MG CR tablet Take 1 tablet (300 mg total) by mouth 2 (two) times daily. (Patient not taking: Reported on 04/30/2016)   No facility-administered  encounter medications on file as of 04/30/2016.     Past Psychiatric History/Hospitalization(s): Anxiety: Yes Bipolar Disorder: No Depression: Yes Mania: No Psychosis: No Schizophrenia: No Personality Disorder: No Hospitalization for psychiatric illness: No History of Electroconvulsive Shock Therapy: No Prior Suicide Attempts: No  Physical Exam: Constitutional:  BP 128/76   Pulse 62   Ht 5\' 6"  (1.676 m)   Wt 295 lb 12.8 oz (134.2 kg)   BMI 47.74 kg/m   General Appearance: alert, oriented, no acute distress  Musculoskeletal: Strength & Muscle Tone: within normal limits Gait & Station: normal Patient leans: N/A Jessica straight  Mental Status Examination/Evaluation: reviewed MSE on 04/30/16 Jessica same as previous visits except as noted  Objective: Attitude: Calm Jessica cooperative  Appearance: Fairly Groomed, appears to be stated age  Patent attorney::  Fair  Speech:  Clear Jessica Coherent Jessica Normal Rate  Volume:  Normal  Mood:  depressed  Affect:  Restricted  Thought Process:  Goal Directed, Linear Jessica Logical  Orientation:  Full (Time, Place, Jessica Person)  Thought Content:  Negative  Suicidal Thoughts:  No  Homicidal Thoughts:  No  Judgement:  Good  Insight:  Good  Concentration: good  Memory: Immediate-good Recent-good Remote-good  Recall: fair  Language: fair  Gait Jessica Station: normal  [employer] of Knowledge: average  Psychomotor Activity:  Normal  Akathisia:  No  Handed:  Right  AIMS (if indicated):  Facial Jessica Oral Movements  Muscles of Facial Expression: None, normal  Lips Jessica Perioral Area: None, normal  Jaw: None, normal  Tongue: None, normal Extremity Movements: Upper (arms, wrists, hands, fingers): None, normal  Lower (legs, knees, ankles, toes): None, normal,  Trunk Movements:  Neck, shoulders, hips: None, normal,  Overall Severity : Severity of abnormal movements (highest score from questions above): None, normal  Incapacitation due to abnormal  movements: None, normal  Patient's awareness of abnormal movements (rate only patient's report): No Awareness, Dental Status  Current problems with teeth Jessica/or dentures?: No  Does patient usually wear dentures?: No    Assets:  Communication Skills Desire for Improvement Financial Resources/Insurance Housing Leisure Time Physical Health Resilience Social Support Talents/Skills      Assessment: Axis I: MDD- recurrent, moderate; GAD; Insomnia  Axis II: deferred     Plan:  Pt is not taking Lithium Celexa 40mg  po qHS to target mood Jessica anxiety Start trial of Buspar 10mg  po BID for anxiety Vistaril 50mg  at bedtime prn insomnia  Medication management with supportive therapy. Risks/benefits Jessica SE of the medication discussed. Pt verbalized understanding Jessica verbal consent obtained for treatment.  Affirm with the patient that the medications are taken as ordered. Patient expressed understanding of how their medications were to be used.    Labs- reviewed labs from PCP on 04/17/2015 - Lipids WNL, HbA1c 5.8, glu 101, 04/30/2015 Hb 11.9; TSH WNL   Therapy: brief supportive therapy provided. Discussed psychosocial  stressors in detail.   Declined therapy referral at this time  Consultations- refer for sleep apnea evaluation  Pt denies SI Jessica is at an acute low risk for suicide.Patient told to call clinic if any problems occur. Patient advised to go to ER if they should develop SI/HI, side effects, or if symptoms worsen. Has crisis numbers to call if needed. Pt verbalized understanding.  F/up in 2 months or sooner if needed.   Oletta Darter, MD 04/30/2016

## 2016-06-24 ENCOUNTER — Emergency Department (HOSPITAL_BASED_OUTPATIENT_CLINIC_OR_DEPARTMENT_OTHER): Payer: BLUE CROSS/BLUE SHIELD

## 2016-06-24 ENCOUNTER — Encounter (HOSPITAL_BASED_OUTPATIENT_CLINIC_OR_DEPARTMENT_OTHER): Payer: Self-pay

## 2016-06-24 ENCOUNTER — Emergency Department (HOSPITAL_BASED_OUTPATIENT_CLINIC_OR_DEPARTMENT_OTHER)
Admission: EM | Admit: 2016-06-24 | Discharge: 2016-06-24 | Disposition: A | Discharge status: Home / Self Care | Payer: BLUE CROSS/BLUE SHIELD | Attending: Emergency Medicine | Admitting: Emergency Medicine

## 2016-06-24 DIAGNOSIS — R51 Headache: Secondary | ICD-10-CM | POA: Insufficient documentation

## 2016-06-24 DIAGNOSIS — R0602 Shortness of breath: Secondary | ICD-10-CM | POA: Diagnosis not present

## 2016-06-24 DIAGNOSIS — R609 Edema, unspecified: Secondary | ICD-10-CM

## 2016-06-24 DIAGNOSIS — R42 Dizziness and giddiness: Secondary | ICD-10-CM | POA: Insufficient documentation

## 2016-06-24 DIAGNOSIS — R600 Localized edema: Secondary | ICD-10-CM | POA: Diagnosis not present

## 2016-06-24 DIAGNOSIS — M7989 Other specified soft tissue disorders: Secondary | ICD-10-CM | POA: Diagnosis not present

## 2016-06-24 DIAGNOSIS — I10 Essential (primary) hypertension: Secondary | ICD-10-CM | POA: Insufficient documentation

## 2016-06-24 LAB — BASIC METABOLIC PANEL
Anion gap: 6 (ref 5–15)
BUN: 9 mg/dL (ref 6–20)
CALCIUM: 8.6 mg/dL — AB (ref 8.9–10.3)
CO2: 27 mmol/L (ref 22–32)
CREATININE: 0.74 mg/dL (ref 0.44–1.00)
Chloride: 103 mmol/L (ref 101–111)
GFR calc non Af Amer: >60 mL/min (ref >60)
Glucose, Bld: 98 mg/dL (ref 65–99)
Potassium: 3.6 mmol/L (ref 3.5–5.1)
SODIUM: 136 mmol/L (ref 135–145)

## 2016-06-24 LAB — CBC WITH DIFFERENTIAL/PLATELET
BASOS PCT: 0 %
Basophils Absolute: 0 10*3/uL (ref 0.0–0.1)
EOS ABS: 0.2 10*3/uL (ref 0.0–0.7)
Eosinophils Relative: 3 %
HEMATOCRIT: 31.7 % — AB (ref 36.0–46.0)
Hemoglobin: 11.1 g/dL — ABNORMAL LOW (ref 12.0–15.0)
LYMPHS ABS: 2.9 10*3/uL (ref 0.7–4.0)
Lymphocytes Relative: 40 %
MCH: 30.2 pg (ref 26.0–34.0)
MCHC: 35 g/dL (ref 30.0–36.0)
MCV: 86.4 fL (ref 78.0–100.0)
MONO ABS: 0.7 10*3/uL (ref 0.1–1.0)
MONOS PCT: 10 %
Neutro Abs: 3.3 10*3/uL (ref 1.7–7.7)
Neutrophils Relative %: 47 %
Platelets: 295 10*3/uL (ref 150–400)
RBC: 3.67 MIL/uL — ABNORMAL LOW (ref 3.87–5.11)
RDW: 13.1 % (ref 11.5–15.5)
WBC: 7.1 10*3/uL (ref 4.0–10.5)

## 2016-06-24 LAB — BRAIN NATRIURETIC PEPTIDE: B Natriuretic Peptide: 48.4 pg/mL (ref 0.0–100.0)

## 2016-06-24 LAB — TROPONIN I: Troponin I: <0.03 ng/mL (ref <0.03)

## 2016-06-24 NOTE — ED Provider Notes (Signed)
[facility] Provider Note   CSN: [id number] Arrival date & time: 06/24/16  1643  By signing my name below, I, Nelwyn Salisbury, attest that this documentation has been prepared under the direction and in the presence of non-physician practitioner, Jaynie Crumble, PA-C. Electronically Signed: Nelwyn Salisbury, Scribe. 06/24/2016. 5:07 PM.  History   Chief Complaint Chief Complaint  Patient presents with  . Leg Swelling   The history is provided by the patient. No language interpreter was used.    HPI Comments:  Jessica Fritz is a 41 y.o. female with pmhx of HTN who presents to the Emergency Department complaining of constant, unchanged left leg swelling onset 2 days. Pt reports associated light-headedness, left leg pain, left arm pain, headache, and SOB. She is prescribed a daily lasix for fluid retention and took  today and  yesterday but does not take her medication every day. Pt also notes that she has anxiety and this could be causing her SOB. She denies any fever. No hx of blood clots, recent travel or recent surgery. Denies orthopnea. No cough or congestion. Pt has been advised to follow a low sodium diet in the past but has not been following this diet plan.   Past Medical History:  Diagnosis Date  . Anxiety   . Depression   . Headache(784.0)   . Hypertension   . Vitamin D deficiency     Patient Active Problem List   Diagnosis Date Noted  . Major depressive disorder, recurrent episode, moderate (HCC) 12/26/2013  . MDD (major depressive disorder), recurrent episode, moderate (HCC) 08/11/2013  . GAD (generalized anxiety disorder) 08/11/2013    Past Surgical History:  Procedure Laterality Date  . EYE SURGERY      OB History    No data available       Home Medications    Prior to Admission medications   Medication Sig Start Date End Date Taking? Authorizing Provider  Ascorbic Acid (VITAMIN C) 100 MG tablet Take 100 mg by mouth daily.    Historical  Provider, MD  atenolol (TENORMIN) 25 MG tablet Take 25 mg by mouth daily.    Historical Provider, MD  busPIRone (BUSPAR) 10 MG tablet Take 1 tablet (10 mg total) by mouth 2 (two) times daily. 04/30/16   Oletta Darter, MD  calcium carbonate (TUMS - DOSED IN MG ELEMENTAL CALCIUM) 500 MG chewable tablet Chew 2 tablets by mouth daily.      Historical Provider, MD  citalopram (CELEXA) 40 MG tablet Take 1 tablet (40 mg total) by mouth daily. 04/30/16   Oletta Darter, MD  furosemide (LASIX) 20 MG tablet Take 20 mg by mouth daily. Reported on 05/14/2015    Historical Provider, MD  hydrOXYzine (ATARAX/VISTARIL) 50 MG tablet Take 1 tablet (50 mg total) by mouth at bedtime. 04/30/16   Oletta Darter, MD  Multiple Vitamin (MULTIVITAMIN) tablet Take 1 tablet by mouth daily.      Historical Provider, MD  vitamin B-12 (CYANOCOBALAMIN) 100 MCG tablet Take 100 mcg by mouth daily.    Historical Provider, MD  Vitamin D, Ergocalciferol, (DRISDOL) 50000 UNITS CAPS capsule Take 50,000 Units by mouth every 7 (seven) days.    Historical Provider, MD    Family History Family History  Problem Relation Age of Onset  . Alcohol abuse Paternal Grandfather   . Anxiety disorder Neg Hx   . Bipolar disorder Neg Hx   . Depression Neg Hx   . Suicidality Neg Hx     Social History Social  History  Substance Use Topics  . Smoking status: Never Smoker  . Smokeless tobacco: Never Used  . Alcohol use Yes     Comment: occ     Allergies   Patient has no known allergies.   Review of Systems Review of Systems  Constitutional: Negative for fever.  Respiratory: Positive for shortness of breath.   Cardiovascular: Positive for leg swelling.  Gastrointestinal: Negative for abdominal pain.  Musculoskeletal: Positive for arthralgias and myalgias.  Neurological: Positive for light-headedness and headaches.  All other systems reviewed and are negative.    Physical Exam Updated Vital Signs BP (!) 148/94 (BP Location: Right Arm)    Pulse 64   Temp 98.2 F (36.8 C) (Oral)   Resp 20   Ht $Remo (1.676 m)   Wt 299 lb (135.6 kg)   LMP  (LMP Unknown)   SpO2 98%   BMI 48.26 kg/m   Physical Exam  Constitutional: She is oriented to person, place, and time. She appears well-developed and well-nourished. No distress.  HENT:  Head: Normocephalic and atraumatic.  Eyes: Conjunctivae are normal.  Cardiovascular: Normal rate, regular rhythm and normal heart sounds.   Pulmonary/Chest: Effort normal and breath sounds normal. No respiratory distress.  Abdominal: She exhibits no distension.  Musculoskeletal:  Bilateral nonpitting edema in lower legs, however exam is difficult to assess due to patient's body habitus and generalized obesity. Dorsal pedal pulses intact and equal bilaterally. Positive left calf tenderness and Homans sign.  Neurological: She is alert and oriented to person, place, and time.  Skin: Skin is warm and dry.  Psychiatric: She has a normal mood and affect.  Nursing note and vitals reviewed.    ED Treatments / Results  DIAGNOSTIC STUDIES:  Oxygen Saturation is 98% on RA, normal by my interpretation.    COORDINATION OF CARE:  5:24 PM Discussed treatment plan with pt at bedside which includes imaging  and pt agreed to plan.  Labs (all labs ordered are listed, but only abnormal results are displayed) Labs Reviewed  CBC WITH DIFFERENTIAL/PLATELET - Abnormal; Notable for the following:       Result Value   RBC 3.67 (*)    Hemoglobin 11.1 (*)    HCT 31.7 (*)    All other components within normal limits  BASIC METABOLIC PANEL - Abnormal; Notable for the following:    Calcium 8.6 (*)    All other components within normal limits  BRAIN NATRIURETIC PEPTIDE  TROPONIN I    EKG  EKG Interpretation None       Radiology Dg Chest 2 View  Result Date: 06/24/2016 CLINICAL DATA:  Shortness of Breath EXAM: CHEST  2 VIEW COMPARISON:  11/12/2010 FINDINGS: Cardiac shadow is at the upper limits of  normal in size. The lungs are free of acute infiltrate or sizable effusion. No acute bony abnormality is noted. IMPRESSION: No acute abnormality seen. Electronically Signed   By: Alcide Clever M.D.   On: 06/24/2016 18:46   US Venous Img Lower Unilateral Left  Result Date: 06/24/2016 CLINICAL DATA:  Left lower extremity pain and swelling EXAM: LEFT LOWER EXTREMITY VENOUS DOPPLER ULTRASOUND TECHNIQUE: Gray-scale sonography with graded compression, as well as color Doppler and duplex ultrasound were performed to evaluate the lower extremity deep venous systems from the level of the common femoral vein and including the common femoral, femoral, profunda femoral, popliteal and calf veins including the posterior tibial, peroneal and gastrocnemius veins when visible. The superficial great saphenous vein was also interrogated.  Spectral Doppler was utilized to evaluate flow at rest and with distal augmentation maneuvers in the common femoral, femoral and popliteal veins. COMPARISON:  None. FINDINGS: Contralateral Common Femoral Vein: Respiratory phasicity is normal and symmetric with the symptomatic side. No evidence of thrombus. Normal compressibility. Common Femoral Vein: No evidence of thrombus. Normal compressibility, respiratory phasicity and response to augmentation. Saphenofemoral Junction: No evidence of thrombus. Normal compressibility and flow on color Doppler imaging. Profunda Femoral Vein: No evidence of thrombus. Normal compressibility and flow on color Doppler imaging. Femoral Vein: No evidence of thrombus. Normal compressibility, respiratory phasicity and response to augmentation. Popliteal Vein: No evidence of thrombus. Normal compressibility, respiratory phasicity and response to augmentation. Calf Veins: No evidence of thrombus. Normal compressibility and flow on color Doppler imaging. Superficial Great Saphenous Vein: No evidence of thrombus. Normal compressibility and flow on color Doppler imaging.  Venous Reflux:  None. Other Findings:  None. IMPRESSION: No evidence of DVT with the left lower extremity. Electronically Signed   By: Alcide Clever M.D.   On: 06/24/2016 18:36    Procedures Procedures (including critical care time)  Medications Ordered in ED Medications - No data to display   Initial Impression / Assessment and Plan / ED Course  I have reviewed the triage vital signs and the nursing notes.  Pertinent labs & imaging results that were available during my care of the patient were reviewed by me and considered in my medical decision making (see chart for details).     Patient with increased lower extreme swelling, history of the same. She takes Lasix only as needed and has had to take a dose yesterday and this morning. She does have swelling in bilateral legs, however it is nonpitting. Patient is morbidly obese. Her lungs are clear. We will check labs, chest x-ray, BNP, will get venous Doppler given left leg pain and subjective increased swelling.  7:54 PM Venous Doppler is negative. Chest x-ray is negative. Labs unremarkable except for mild anemia of 11.1. BNP is negative. Troponin is 0. Suspect dependent edema due to poor diet and body habitus and lack of activity. Discussed keeping legs elevated, discussed diet choices, increase activity. Continue to wear compression stockings. Follow-up with family doctor as needed.  Vitals:   06/24/16 1655 06/24/16 1704 06/24/16 1941  BP: (!) 148/94  (!) 158/85  Pulse: 64  64  Resp: 20  18  Temp: 98.2 F (36.8 C)    TempSrc: Oral    SpO2: 98%  100%  Weight: 133.8 kg 135.6 kg   Height:  (1.676 m)       Final Clinical Impressions(s) / ED Diagnoses   Final diagnoses:  Peripheral edema    New Prescriptions New Prescriptions   No medications on file   I personally performed the services described in this documentation, which was scribed in my presence. The recorded information has been reviewed and is accurate.       Jaynie Crumble, PA-C 06/24/16 1956    Melene Plan, DO 06/24/16 [phone]

## 2016-06-24 NOTE — Discharge Instructions (Signed)
Continue to take your fluid pills at home. Keep her legs elevated as much as possible, continue to wear compression stockings. Watch her diet carefully, decrease sodium intake. Exercise. Follow-up with family doctor as needed.

## 2016-06-24 NOTE — ED Triage Notes (Addendum)
c/o swelling to left LE x 2 days-does take lasix for fluid retention-also c/o elevated BP and HA "slight"-SOB today-NAD

## 2016-06-24 NOTE — ED Notes (Signed)
Pt told x 2 to get into a gown so her legs can be assessed. Pt still lying in bed texting.

## 2016-07-02 ENCOUNTER — Ambulatory Visit (HOSPITAL_COMMUNITY): Payer: Self-pay | Admitting: Psychiatry

## 2016-07-03 ENCOUNTER — Other Ambulatory Visit (HOSPITAL_COMMUNITY): Payer: Self-pay | Admitting: Psychiatry

## 2016-07-03 DIAGNOSIS — F331 Major depressive disorder, recurrent, moderate: Secondary | ICD-10-CM

## 2016-07-03 DIAGNOSIS — I10 Essential (primary) hypertension: Secondary | ICD-10-CM | POA: Diagnosis not present

## 2016-07-03 DIAGNOSIS — R600 Localized edema: Secondary | ICD-10-CM | POA: Diagnosis not present

## 2016-07-03 DIAGNOSIS — F411 Generalized anxiety disorder: Secondary | ICD-10-CM

## 2016-07-21 DIAGNOSIS — R42 Dizziness and giddiness: Secondary | ICD-10-CM | POA: Diagnosis not present

## 2016-07-21 DIAGNOSIS — I10 Essential (primary) hypertension: Secondary | ICD-10-CM | POA: Diagnosis not present

## 2016-08-13 ENCOUNTER — Ambulatory Visit (INDEPENDENT_AMBULATORY_CARE_PROVIDER_SITE_OTHER): Payer: BLUE CROSS/BLUE SHIELD | Admitting: Psychiatry

## 2016-08-13 ENCOUNTER — Encounter (HOSPITAL_COMMUNITY): Payer: Self-pay | Admitting: Psychiatry

## 2016-08-13 DIAGNOSIS — F411 Generalized anxiety disorder: Secondary | ICD-10-CM

## 2016-08-13 DIAGNOSIS — F99 Mental disorder, not otherwise specified: Secondary | ICD-10-CM | POA: Diagnosis not present

## 2016-08-13 DIAGNOSIS — Z811 Family history of alcohol abuse and dependence: Secondary | ICD-10-CM | POA: Diagnosis not present

## 2016-08-13 DIAGNOSIS — F331 Major depressive disorder, recurrent, moderate: Secondary | ICD-10-CM | POA: Diagnosis not present

## 2016-08-13 DIAGNOSIS — F5105 Insomnia due to other mental disorder: Secondary | ICD-10-CM

## 2016-08-13 MED ORDER — CITALOPRAM HYDROBROMIDE 40 MG PO TABS: 40.0000 mg | ORAL_TABLET | Freq: Every day | ORAL | 0 refills | Status: DC

## 2016-08-13 MED ORDER — BUSPIRONE HCL 15 MG PO TABS: 15.0000 mg | ORAL_TABLET | Freq: Two times a day (BID) | ORAL | 0 refills | Status: DC

## 2016-08-13 MED ORDER — HYDROXYZINE HCL 50 MG PO TABS: 50.0000 mg | ORAL_TABLET | Freq: Every day | ORAL | 0 refills | Status: DC

## 2016-08-13 NOTE — Progress Notes (Signed)
BH MD/PA/NP OP Progress Note  08/13/2016 8:43 AM Jessica Fritz  MRN:  [id number]  Chief Complaint:  Chief Complaint    Follow-up      HPI: Pt states for the last couple of days she has been "feeling out of it". States she ran out of Buspar a few days ago and wonders if that is the reason. She has been extremely tired and feels like she can't breath. Pt thinks it could be increased anxiety but she is not sure. She has a lot of changes at work that are stressing her out. In the last week she couldn't get out of bed and didn't go to work for 3 days.   Her best friend passed away in October. It has been on her mind a lot. A co-workers acts a lot like him and it serves as a daily reminder. She is stressed financially. Her depression is a little better. She has days of low motivation and low energy. Pt states she is sleeping but still wakes up tired. Pt denies SI/HI.   Taking meds as prescribed and denies SE.    Visit Diagnosis:    ICD-10-CM   1. Major depressive disorder, recurrent episode, moderate (HCC) F33.1 hydrOXYzine (ATARAX/VISTARIL) 50 MG tablet    citalopram (CELEXA) 40 MG tablet    busPIRone (BUSPAR) 15 MG tablet  2. GAD (generalized anxiety disorder) F41.1 hydrOXYzine (ATARAX/VISTARIL) 50 MG tablet    citalopram (CELEXA) 40 MG tablet    busPIRone (BUSPAR) 15 MG tablet  3. Insomnia due to other mental disorder F51.05 hydrOXYzine (ATARAX/VISTARIL) 50 MG tablet   F99      Past Psychiatric History:  Anxiety: Yes Bipolar Disorder: No Depression: Yes Mania: No Psychosis: No Schizophrenia: No Personality Disorder: No Hospitalization for psychiatric illness: No History of Electroconvulsive Shock Therapy: No Prior Suicide Attempts: No  Past Medical History:  Past Medical History:  Diagnosis Date  . Anxiety   . Depression   . Headache(784.0)   . Hypertension   . Vitamin D deficiency     Past Surgical History:  Procedure Laterality Date  . EYE SURGERY      Family  Psychiatric History: Family History  Problem Relation Age of Onset  . Alcohol abuse Paternal Grandfather   . Anxiety disorder Neg Hx   . Bipolar disorder Neg Hx   . Depression Neg Hx   . Suicidality Neg Hx     Social History:  Social History   Social History  . Marital status: Single    Spouse name: N/A  . Number of children: N/A  . Years of education: N/A   Social History Main Topics  . Smoking status: Never Smoker  . Smokeless tobacco: Never Used  . Alcohol use Yes     Comment: occ  . Drug use: No  . Sexual activity: No   Other Topics Concern  . Not on file   Social History Narrative  . No narrative on file    Allergies: No Known Allergies  Metabolic Disorder Labs: No results found for: HGBA1C, MPG No results found for: PROLACTIN No results found for: CHOL, TRIG, HDL, CHOLHDL, VLDL, LDLCALC   Current Medications: Current Outpatient Prescriptions  Medication Sig Dispense Refill  . Ascorbic Acid (VITAMIN C) 100 MG tablet Take 100 mg by mouth daily.    Marland Kitchen. atenolol (TENORMIN) 25 MG tablet Take 25 mg by mouth daily.    . busPIRone (BUSPAR) 10 MG tablet Take 1 tablet (10 mg total) by mouth 2 (two)  times daily. 60 tablet 1  . calcium carbonate (TUMS - DOSED IN MG ELEMENTAL CALCIUM) 500 MG chewable tablet Chew 2 tablets by mouth daily.      . citalopram (CELEXA) 40 MG tablet Take 1 tablet (40 mg total) by mouth daily. 90 tablet 1  . furosemide (LASIX) 20 MG tablet Take 20 mg by mouth daily. Reported on 05/14/2015    . hydrOXYzine (ATARAX/VISTARIL) 50 MG tablet Take 1 tablet (50 mg total) by mouth at bedtime. 90 tablet 1  . Multiple Vitamin (MULTIVITAMIN) tablet Take 1 tablet by mouth daily.      . vitamin B-12 (CYANOCOBALAMIN) 100 MCG tablet Take 100 mcg by mouth daily.    . Vitamin D, Ergocalciferol, (DRISDOL) 50000 UNITS CAPS capsule Take 50,000 Units by mouth every 7 (seven) days.     No current facility-administered medications for this visit.      Musculoskeletal: Strength & Muscle Tone: within normal limits Gait & Station: normal Patient leans: N/A  Psychiatric Specialty Exam: Review of Systems  Cardiovascular: Positive for chest pain and leg swelling. Negative for palpitations.  Neurological: Positive for dizziness and headaches. Negative for sensory change and speech change.  Psychiatric/Behavioral: Positive for depression. Negative for hallucinations, substance abuse and suicidal ideas. The patient is nervous/anxious. The patient does not have insomnia.     Blood pressure 122/72, pulse 72, height 5\' 6"  (1.676 m), weight 297 lb 12.8 oz (135.1 kg).Body mass index is 48.07 kg/m.  General Appearance: Fairly Groomed  Eye Contact:  Good  Speech:  Clear and Coherent and Normal Rate  Volume:  Normal  Mood:  Anxious and Depressed  Affect:  Congruent  Thought Process:  Goal Directed and Descriptions of Associations: Intact  Orientation:  Full (Time, Place, and Person)  Thought Content: Logical   Suicidal Thoughts:  No  Homicidal Thoughts:  No  Memory:  Immediate;   Good Recent;   Good Remote;   Good  Judgement:  Good  Insight:  Good  Psychomotor Activity:  Normal  Concentration:  Concentration: Good and Attention Span: Good  Recall:  Good  Fund of Knowledge: Good  Language: Good  Akathisia:  No  Handed:  Right  AIMS (if indicated):  n/a  Assets:  Communication Skills Desire for Improvement Housing Leisure Time Resilience Talents/Skills Transportation Vocational/Educational  ADL's:  Intact  Cognition: WNL  Sleep:  good     Treatment Plan Summary:Medication management   Assessment: MDD-recurrent, moderate; GAD; Insomnia   Medication management with supportive therapy. Risks/benefits and SE of the medication discussed. Pt verbalized understanding and verbal consent obtained for treatment.  Affirm with the patient that the medications are taken as ordered. Patient expressed understanding of how their  medications were to be used.   Meds: Celexa 40mg  po qHS for depression and anxiety Increase Buspar to 15mg  po BID for anxiety Vistaril 50mg  po qHS prn insomnia  Labs: none  Therapy: brief supportive therapy provided. Discussed psychosocial stressors in detail.    Consultations:none  Pt denies SI and is at an acute low risk for suicide. Patient told to call clinic if any problems occur. Patient advised to go to ER if they should develop SI/HI, side effects, or if symptoms worsen. Has crisis numbers to call if needed. Pt verbalized understanding.  F/up in 2 months or sooner if needed    Oletta Darter, MD 08/13/2016, 8:43 AM

## 2016-10-09 DIAGNOSIS — R51 Headache: Secondary | ICD-10-CM | POA: Diagnosis not present

## 2016-10-14 ENCOUNTER — Telehealth: Payer: Self-pay | Admitting: Neurology

## 2016-10-14 NOTE — Telephone Encounter (Signed)
Returned call to [place] earlier today.  The patient's appt has been moved to 10/15/16 at 4pm.  She call the patient to let her know of the new date and to arrive to our office at 3:30pm.

## 2016-10-14 NOTE — Telephone Encounter (Signed)
Kristen from [facility] calling for Dr Ronney Asters has stated the condition of Pt  Eyes has worsen and would like to see if pt can be seen sooner, please call if there is a way to get pt in earlier.  Pt has been put on wait list

## 2016-10-15 ENCOUNTER — Encounter: Payer: Self-pay | Admitting: Neurology

## 2016-10-15 ENCOUNTER — Ambulatory Visit (HOSPITAL_COMMUNITY): Payer: Self-pay | Admitting: Psychiatry

## 2016-10-15 ENCOUNTER — Ambulatory Visit (INDEPENDENT_AMBULATORY_CARE_PROVIDER_SITE_OTHER): Payer: BLUE CROSS/BLUE SHIELD | Admitting: Neurology

## 2016-10-15 ENCOUNTER — Encounter (HOSPITAL_COMMUNITY): Payer: Self-pay | Admitting: Psychiatry

## 2016-10-15 ENCOUNTER — Ambulatory Visit (INDEPENDENT_AMBULATORY_CARE_PROVIDER_SITE_OTHER): Payer: BLUE CROSS/BLUE SHIELD | Admitting: Psychiatry

## 2016-10-15 VITALS — Ht 66.0 in | Wt 294.8 lb

## 2016-10-15 DIAGNOSIS — R51 Headache: Secondary | ICD-10-CM

## 2016-10-15 DIAGNOSIS — F99 Mental disorder, not otherwise specified: Secondary | ICD-10-CM | POA: Diagnosis not present

## 2016-10-15 DIAGNOSIS — F5105 Insomnia due to other mental disorder: Secondary | ICD-10-CM | POA: Diagnosis not present

## 2016-10-15 DIAGNOSIS — F331 Major depressive disorder, recurrent, moderate: Secondary | ICD-10-CM | POA: Diagnosis not present

## 2016-10-15 DIAGNOSIS — R4584 Anhedonia: Secondary | ICD-10-CM

## 2016-10-15 DIAGNOSIS — G43709 Chronic migraine without aura, not intractable, without status migrainosus: Secondary | ICD-10-CM | POA: Diagnosis not present

## 2016-10-15 DIAGNOSIS — R519 Headache, unspecified: Secondary | ICD-10-CM

## 2016-10-15 DIAGNOSIS — Z811 Family history of alcohol abuse and dependence: Secondary | ICD-10-CM

## 2016-10-15 DIAGNOSIS — F411 Generalized anxiety disorder: Secondary | ICD-10-CM

## 2016-10-15 DIAGNOSIS — H4710 Unspecified papilledema: Secondary | ICD-10-CM | POA: Diagnosis not present

## 2016-10-15 DIAGNOSIS — IMO0002 Reserved for concepts with insufficient information to code with codable children: Secondary | ICD-10-CM

## 2016-10-15 DIAGNOSIS — F410 Panic disorder [episodic paroxysmal anxiety] without agoraphobia: Secondary | ICD-10-CM

## 2016-10-15 MED ORDER — HYDROXYZINE HCL 50 MG PO TABS: 50.0000 mg | ORAL_TABLET | Freq: Every day | ORAL | 0 refills | Status: DC

## 2016-10-15 MED ORDER — BUSPIRONE HCL 15 MG PO TABS: 15.0000 mg | ORAL_TABLET | Freq: Two times a day (BID) | ORAL | 0 refills | Status: DC

## 2016-10-15 MED ORDER — CITALOPRAM HYDROBROMIDE 40 MG PO TABS: 40.0000 mg | ORAL_TABLET | Freq: Every day | ORAL | 0 refills | Status: DC

## 2016-10-15 MED ORDER — SUMATRIPTAN SUCCINATE 100 MG PO TABS: 100.0000 mg | ORAL_TABLET | Freq: Once | ORAL | 6 refills | Status: DC | PRN

## 2016-10-15 MED ORDER — TOPIRAMATE 25 MG PO TABS: 50.0000 mg | ORAL_TABLET | Freq: Two times a day (BID) | ORAL | 3 refills | Status: DC

## 2016-10-15 NOTE — Patient Instructions (Signed)
Magnesium oxide 400 mg twice a day Riboflavin  100 mg twice a day 

## 2016-10-15 NOTE — Progress Notes (Signed)
PATIENT: Jessica Fritz DOB: [birth year]  Chief Complaint  Patient presents with  . Papilledema    She is here for further evaluation of right eye papilledema.  [facility]    Fredrich Birks, OD - referring provider  . PCP    Mila Palmer, MD     HISTORICAL  Jessica Fritz is a 41 years old right-handed female  seen in refer by her optometrist Dr.  Lorin Picket, Jon,for evaluation of right side papillary edema, initial evaluation was October 15 2016.   She had past medical history of obesity, wearing glasses for many years, has seen Dr. Lorin Picket for many years, during her most recent yearly checkup in August 2018, she was noted to have mild right papillary edema.  She reported long history of migraine headaches since high school, her typical migraine are severe lateralized pounding headache with associated light noise sensitivity, nauseous, lasting for a few hours, usually clustered around her menstruation period of time, since June 2018 she noticed increased headache, she also described transient blurry vision with sudden positional change such as bending over, most of her headache at right side now. She also complains of increased blurry vision  She has chronic obesity, there was no significant weight change.  REVIEW OF SYSTEMS: Full 14 system review of systems performed and notable only for eye pain, blurry vision, fatigue, swelling legs, spinning sensation, depression anxiety too much sleep, disinteresting activity decreased energy  ALLERGIES: No Known Allergies  HOME MEDICATIONS: Current Outpatient Prescriptions  Medication Sig Dispense Refill  . albuterol (PROVENTIL) (2.5 MG/3ML) 0.083% nebulizer solution Take 2.5 mg by nebulization every 6 (six) hours as needed for wheezing or shortness of breath.    . Ascorbic Acid (VITAMIN C) 100 MG tablet Take 100 mg by mouth daily.    Marland Kitchen atenolol (TENORMIN) 25 MG tablet Take 25 mg by mouth daily.    . busPIRone (BUSPAR) 15 MG tablet Take 1 tablet  (15 mg total) by mouth 2 (two) times daily. 180 tablet 0  . calcium carbonate (TUMS - DOSED IN MG ELEMENTAL CALCIUM) 500 MG chewable tablet Chew 2 tablets by mouth daily.      . citalopram (CELEXA) 40 MG tablet Take 1 tablet (40 mg total) by mouth daily. 90 tablet 0  . hydrOXYzine (ATARAX/VISTARIL) 50 MG tablet Take 1 tablet (50 mg total) by mouth at bedtime. 90 tablet 0  . Multiple Vitamin (MULTIVITAMIN) tablet Take 1 tablet by mouth daily.      Marland Kitchen triamterene-hydrochlorothiazide (DYAZIDE) 37.5-25 MG capsule Take 1 capsule by mouth daily.    . vitamin B-12 (CYANOCOBALAMIN) 100 MCG tablet Take 100 mcg by mouth daily.    . Vitamin D, Ergocalciferol, (DRISDOL) 50000 UNITS CAPS capsule Take 50,000 Units by mouth every 7 (seven) days.     No current facility-administered medications for this visit.     PAST MEDICAL HISTORY: Past Medical History:  Diagnosis Date  . Anxiety   . Depression   . Headache(784.0)   . Hypertension   . Migraine   . Papilledema, right eye   . Vitamin D deficiency     PAST SURGICAL HISTORY: Past Surgical History:  Procedure Laterality Date  . EYE SURGERY      FAMILY HISTORY: Family History  Problem Relation Age of Onset  . Alcohol abuse Paternal Grandfather   . Healthy Mother   . Diabetes Father   . Anxiety disorder Neg Hx   . Bipolar disorder Neg Hx   . Depression Neg Hx   .  Suicidality Neg Hx     SOCIAL HISTORY:  Social History   Social History  . Marital status: Single    Spouse name: N/A  . Number of children: 0  . Years of education: some college   Occupational History  . Credit card collection for [employer]   Social History Main Topics  . Smoking status: Never Smoker  . Smokeless tobacco: Never Used  . Alcohol use Yes     Comment: occ  . Drug use: No  . Sexual activity: No   Other Topics Concern  . Not on file   Social History Narrative   Lives at home alone.   Right-handed.   3-4 cups caffeine per day.      PHYSICAL EXAM   Vitals:   10/15/16 1600  Weight: 294 lb 12 oz (133.7 kg)  Height: 5\' 6"  (1.676 m)    Not recorded      Body mass index is 47.57 kg/m.  PHYSICAL EXAMNIATION:  Gen: NAD, conversant, well nourised, obese, well groomed                     Cardiovascular: Regular rate rhythm, no peripheral edema, warm, nontender. Eyes: Conjunctivae clear without exudates or hemorrhage Neck: Supple, no carotid bruits. Pulmonary: Clear to auscultation bilaterally   NEUROLOGICAL EXAM:  MENTAL STATUS: Speech:    Speech is normal; fluent and spontaneous with normal comprehension.  Cognition:     Orientation to time, place and person     Normal recent and remote memory     Normal Attention span and concentration     Normal Language, naming, repeating,spontaneous speech     Fund of knowledge   CRANIAL NERVES: CN II: Visual fields are full to confrontation.I was not able to visualize her fundi clearly  VI: extraocular movement are normal. No ptosis. CN V: Facial sensation is intact to pinprick in all 3 divisions bilaterally. Corneal responses are intact.  CN VII: Face is symmetric with normal eye closure and smile. CN VIII: Hearing is normal to rubbing fingers CN IX, X: Palate elevates symmetrically. Phonation is normal. CN XI: Head turning and shoulder shrug are intact CN XII: Tongue is midline with normal movements and no atrophy.  MOTOR: There is no pronator drift of out-stretched arms. Muscle bulk and tone are normal. Muscle strength is normal.  REFLEXES: Reflexes are 2+ and symmetric at the biceps, triceps, knees, and ankles. Plantar responses are flexor.  SENSORY: Intact to light touch, pinprick, positional sensation and vibratory sensation are intact in fingers and toes.  COORDINATION: Rapid alternating movements and fine finger movements are intact. There is no dysmetria on finger-to-nose and heel-knee-shin.    GAIT/STANCE: Posture is normal. Gait is steady  with normal steps, base, arm swing, and turning. Heel and toe walking are normal. Tandem gait is normal.  Romberg is absent.   DIAGNOSTIC DATA (LABS, IMAGING, TESTING) - I reviewed patient records, labs, notes, testing and imaging myself where available.   ASSESSMENT AND PLAN  Smita Lesh is a 41 y.o. female   Possible pseudotumor cerebri  She certainly is at increased risk for pseudotumor cerebri, with her obesity,  Topamax titrating to 100 twice a day  MRI of brain, MRV of brain Chronic migraine headache  Imitrex as needed    Levert Feinstein, M.D. Ph.D.  [facility] [address] [place], [place] [address] Ph: [phone] Fax: [phone]  CC: Fredrich Birks, OD

## 2016-10-15 NOTE — Progress Notes (Signed)
BH MD/PA/NP OP Progress Note  10/15/2016 10:38 AM Jessica Fritz  MRN:  [id number]  Chief Complaint:  Chief Complaint    Follow-up     HPI: Pt reports she is having right optic nerve swelling that is causing blurred vision and pain. She is having an emergency MRI later today.  States she is stressed by her health. These last few months have been anniversary of deaths. She is feeling overwhelmed. The last 2 weeks she has been very depressed. She reports she has been spending all day in bed sleeping. She reports low energy, low motivation, anhedonia and worthlessness. She has on/off hopelessness. Pt denies SI/HI/AVH.  Anxiety is high. She reports some panic attack like symptoms- can't breath, gasping for air, can't think, overly nervous. It has happened frequently over the last 2 weeks.   States the meds have been helping but not as much over the last 2 weeks. Taking meds as prescribed and denies SE.   Visit Diagnosis:    ICD-10-CM   1. Major depressive disorder, recurrent episode, moderate (HCC) F33.1 busPIRone (BUSPAR) 15 MG tablet    citalopram (CELEXA) 40 MG tablet    hydrOXYzine (ATARAX/VISTARIL) 50 MG tablet  2. GAD (generalized anxiety disorder) F41.1 busPIRone (BUSPAR) 15 MG tablet    citalopram (CELEXA) 40 MG tablet    hydrOXYzine (ATARAX/VISTARIL) 50 MG tablet  3. Insomnia due to other mental disorder F51.05 hydrOXYzine (ATARAX/VISTARIL) 50 MG tablet   F99       Past Psychiatric History:  Anxiety:Yes Bipolar Disorder:No Depression:Yes Mania:No Psychosis:No Schizophrenia:No Personality Disorder:No Hospitalization for psychiatric illness:No History of Electroconvulsive Shock Therapy:No Prior Suicide Attempts:No   Past Medical History:  Past Medical History:  Diagnosis Date  . Anxiety   . Depression   . Headache(784.0)   . Hypertension   . Vitamin D deficiency     Past Surgical History:  Procedure Laterality Date  . EYE SURGERY      Family  Psychiatric History: Family History  Problem Relation Age of Onset  . Alcohol abuse Paternal Grandfather   . Anxiety disorder Neg Hx   . Bipolar disorder Neg Hx   . Depression Neg Hx   . Suicidality Neg Hx     Social History:  Social History   Social History  . Marital status: Single    Spouse name: N/A  . Number of children: N/A  . Years of education: N/A   Social History Main Topics  . Smoking status: Never Smoker  . Smokeless tobacco: Never Used  . Alcohol use Yes     Comment: occ  . Drug use: No  . Sexual activity: No   Other Topics Concern  . None   Social History Narrative  . None    Allergies: No Known Allergies  Metabolic Disorder Labs: No results found for: HGBA1C, MPG No results found for: PROLACTIN No results found for: CHOL, TRIG, HDL, CHOLHDL, VLDL, LDLCALC No results found for: TSH  Therapeutic Level Labs: No results found for: LITHIUM No results found for: VALPROATE No components found for:  CBMZ  Current Medications: Current Outpatient Prescriptions  Medication Sig Dispense Refill  . albuterol (PROVENTIL) (2.5 MG/3ML) 0.083% nebulizer solution Take 2.5 mg by nebulization every 6 (six) hours as needed for wheezing or shortness of breath.    . Ascorbic Acid (VITAMIN C) 100 MG tablet Take 100 mg by mouth daily.    Marland Kitchen atenolol (TENORMIN) 25 MG tablet Take 25 mg by mouth daily.    . busPIRone (BUSPAR)  15 MG tablet Take 1 tablet (15 mg total) by mouth 2 (two) times daily. 180 tablet 0  . calcium carbonate (TUMS - DOSED IN MG ELEMENTAL CALCIUM) 500 MG chewable tablet Chew 2 tablets by mouth daily.      . citalopram (CELEXA) 40 MG tablet Take 1 tablet (40 mg total) by mouth daily. 90 tablet 0  . hydrOXYzine (ATARAX/VISTARIL) 50 MG tablet Take 1 tablet (50 mg total) by mouth at bedtime. 90 tablet 0  . Multiple Vitamin (MULTIVITAMIN) tablet Take 1 tablet by mouth daily.      Marland Kitchen triamterene-hydrochlorothiazide (DYAZIDE) 37.5-25 MG capsule Take 1 capsule by  mouth daily.    . vitamin B-12 (CYANOCOBALAMIN) 100 MCG tablet Take 100 mcg by mouth daily.    . Vitamin D, Ergocalciferol, (DRISDOL) 50000 UNITS CAPS capsule Take 50,000 Units by mouth every 7 (seven) days.     No current facility-administered medications for this visit.      Musculoskeletal: Strength & Muscle Tone: within normal limits Gait & Station: normal Patient leans: N/A  Psychiatric Specialty Exam: Review of Systems  Eyes: Positive for blurred vision and pain. Negative for discharge and redness.  Neurological: Positive for dizziness, sensory change and headaches. Negative for speech change.    Blood pressure 132/82, pulse 71, height 5\' 6"  (1.676 m), weight 296 lb 6.4 oz (134.4 kg).Body mass index is 47.84 kg/m.  General Appearance: Fairly Groomed  Eye Contact:  Good  Speech:  Clear and Coherent and Normal Rate  Volume:  Normal  Mood:  Anxious and Depressed  Affect:  Congruent  Thought Process:  Goal Directed and Descriptions of Associations: Intact  Orientation:  Full (Time, Place, and Person)  Thought Content: Logical   Suicidal Thoughts:  No  Homicidal Thoughts:  No  Memory:  Immediate;   Good Recent;   Good Remote;   Good  Judgement:  Good  Insight:  Good  Psychomotor Activity:  Normal  Concentration:  Concentration: Good and Attention Span: Good  Recall:  Good  Fund of Knowledge: Good  Language: Good  Akathisia:  No  Handed:  Right  AIMS (if indicated): not done  Assets:  Communication Skills Desire for Improvement Housing Leisure Time Talents/Skills Transportation  ADL's:  Intact  Cognition: WNL  Sleep:  Good    Assessment and Plan: MDD-recurrent, moderate; GAD; insomnia    Medication management with supportive therapy. Risks/benefits and SE of the medication discussed. Pt verbalized understanding and verbal consent obtained for treatment.  Affirm with the patient that the medications are taken as ordered. Patient expressed understanding of how  their medications were to be used.    Meds: Celexa 40mg  po qHS for depression and anxiety Buspar 15mg  po BID for anxiety Vistaril 50mg  po qHS prn insomnia Pt does not want meds changed at this time.   Labs: none  Therapy: brief supportive therapy provided. Discussed psychosocial stressors in detail.     Consultations: refer for therapy  Pt denies SI and is at an acute low risk for suicide. Patient told to call clinic if any problems occur. Patient advised to go to ER if they should develop SI/HI, side effects, or if symptoms worsen. Has crisis numbers to call if needed. Pt verbalized understanding.  F/up in 2 months or sooner if needed  Oletta Darter, MD 10/15/2016, 10:38 AM

## 2016-10-19 ENCOUNTER — Telehealth: Payer: Self-pay | Admitting: Neurology

## 2016-10-19 ENCOUNTER — Other Ambulatory Visit: Payer: Self-pay | Admitting: *Deleted

## 2016-10-19 MED ORDER — RIZATRIPTAN BENZOATE 5 MG PO TABS: 5.0000 mg | ORAL_TABLET | ORAL | 5 refills | Status: DC | PRN

## 2016-10-19 NOTE — Telephone Encounter (Addendum)
Spoke to patient - she does not like the way sumatriptan makes her feels overall (body aches, back pain).  Dr. Terrace Arabia has authorized rizatriptan 5mg .  Patient agreeable to the new medication.

## 2016-10-19 NOTE — Telephone Encounter (Signed)
Pt called she is not tolerating SUMAtriptan (IMITREX) 100 MG tablet . She tried it Thursday, Friday and Saturday. It stops the HA but she gets severe back pain and has to lie down. Please call to discuss the possibility of another medication.

## 2016-10-21 NOTE — Telephone Encounter (Signed)
Patient calling saying her insurance will not pay for rizatriptan (MAXALT) 5 MG tablet because drug is too similar to Sumatriptan.

## 2016-10-21 NOTE — Telephone Encounter (Signed)
Returned call to pharmacy - spoke to [place] and he is aware of the approval.  She just filled sumatriptan and had adverse side effects.  The pharmacy may still have to contact her insurance for an override but this should not be a problem since the PA is now in place.  Patient is aware and expressed appreciation.

## 2016-10-21 NOTE — Telephone Encounter (Signed)
Called pharmacy - medication needs PA.  Called [facility] at [phone].  PA approved through 10/22/2019.  Pt [id number].  [id number].  Approval valid through 10/22/2019.

## 2016-10-27 DIAGNOSIS — Z0000 Encounter for general adult medical examination without abnormal findings: Secondary | ICD-10-CM | POA: Diagnosis not present

## 2016-10-27 DIAGNOSIS — I10 Essential (primary) hypertension: Secondary | ICD-10-CM | POA: Diagnosis not present

## 2016-10-27 DIAGNOSIS — E559 Vitamin D deficiency, unspecified: Secondary | ICD-10-CM | POA: Diagnosis not present

## 2016-10-27 DIAGNOSIS — R7303 Prediabetes: Secondary | ICD-10-CM | POA: Diagnosis not present

## 2016-10-27 DIAGNOSIS — H4710 Unspecified papilledema: Secondary | ICD-10-CM | POA: Diagnosis not present

## 2016-10-27 DIAGNOSIS — Z79899 Other long term (current) drug therapy: Secondary | ICD-10-CM | POA: Diagnosis not present

## 2016-10-28 ENCOUNTER — Telehealth: Payer: Self-pay | Admitting: Neurology

## 2016-10-28 DIAGNOSIS — G932 Benign intracranial hypertension: Secondary | ICD-10-CM

## 2016-10-28 NOTE — Telephone Encounter (Signed)
Please call patient, I have personally reviewed MRI of the brain showed no significant abnormality with exception of empty sella which is consistent with pseudotumor cerebri,  MRV showed signal loss at bilateral transverse sinus/sigmoid sinus junction, this could due to turbulence versus stenosis,  Proceed with fluoroscopy guided lumbar puncture

## 2016-10-28 NOTE — Telephone Encounter (Signed)
Noted, Jessica Fritz picked the disc at [facility].

## 2016-10-28 NOTE — Telephone Encounter (Signed)
I received   phone call from [facility] neuroradiologist Dr. Domingo Dimesonald Heck [phone].  That there is a concern of bilateral transverse venous thrombosis, he mentioned possibility of venous stent, venogram.  Will arrange LP once formal report is available.

## 2016-10-28 NOTE — Telephone Encounter (Signed)
Called the pharmacy again - [facility] to [facility] and provided the information below.  Stated she will call the patient's insurance today.  Returned call to pharmacy - She just filled sumatriptan and had adverse side effects.  The pharmacy may still have to contact her insurance for an override but this should not be a problem since the PA is now in place.  Patient is aware and expressed appreciation.        Called pharmacy - medication needs PA.  Called [facility] at [phone].  PA approved through 10/22/2019.  Pt [id number].  [id number].  Approval valid through 10/22/2019.

## 2016-10-28 NOTE — Telephone Encounter (Signed)
Spoke to patient - she has been informed of results and willing to proceed with LP.  She is aware to expect a call for scheduling.

## 2016-10-28 NOTE — Telephone Encounter (Signed)
Jessica Fritz  is asking for a call back re: the prior authorization for the rizatriptan (MAXALT) 5 MG tablet, he is asking for a call back please

## 2016-10-30 ENCOUNTER — Telehealth: Payer: Self-pay | Admitting: Neurology

## 2016-10-30 NOTE — Telephone Encounter (Signed)
Pt has questions as to how to take the rizatriptan (MAXALT) 5 MG tablet Pt would like a call back, she just got the medication on yesterday

## 2016-10-30 NOTE — Telephone Encounter (Signed)
She should take Maxalt as needed at the beginning of the headache

## 2016-10-30 NOTE — Telephone Encounter (Signed)
I called pt and [facility] that she is to take just as needed on the onset of migraine, may take another tablet in 2 hours if needed (only 2 tablets in a 24hour period.  Pt is to call back if has questions.

## 2016-10-30 NOTE — Telephone Encounter (Signed)
[facility] October 27 2016  MRI of the brain showed no acute abnormality, expanded empty sella, slight prominence of the ventricular system,

## 2016-11-04 ENCOUNTER — Telehealth (HOSPITAL_COMMUNITY): Payer: Self-pay | Admitting: Psychiatry

## 2016-11-05 ENCOUNTER — Other Ambulatory Visit (HOSPITAL_COMMUNITY): Payer: Self-pay | Admitting: Psychiatry

## 2016-11-05 DIAGNOSIS — F331 Major depressive disorder, recurrent, moderate: Secondary | ICD-10-CM

## 2016-11-05 DIAGNOSIS — F411 Generalized anxiety disorder: Secondary | ICD-10-CM

## 2016-11-05 DIAGNOSIS — F99 Mental disorder, not otherwise specified: Secondary | ICD-10-CM

## 2016-11-05 DIAGNOSIS — F5105 Insomnia due to other mental disorder: Secondary | ICD-10-CM

## 2016-11-16 ENCOUNTER — Ambulatory Visit: Payer: Self-pay | Admitting: Neurology

## 2016-11-20 ENCOUNTER — Telehealth: Payer: Self-pay | Admitting: Neurology

## 2016-11-20 ENCOUNTER — Ambulatory Visit
Admission: RE | Admit: 2016-11-20 | Discharge: 2016-11-20 | Disposition: A | Discharge status: Home / Self Care | Payer: BLUE CROSS/BLUE SHIELD | Source: Ambulatory Visit | Attending: Neurology | Admitting: Neurology

## 2016-11-20 VITALS — BP 99/73 | HR 59

## 2016-11-20 DIAGNOSIS — G932 Benign intracranial hypertension: Secondary | ICD-10-CM | POA: Diagnosis not present

## 2016-11-20 DIAGNOSIS — H4710 Unspecified papilledema: Secondary | ICD-10-CM

## 2016-11-20 DIAGNOSIS — G43709 Chronic migraine without aura, not intractable, without status migrainosus: Secondary | ICD-10-CM

## 2016-11-20 DIAGNOSIS — IMO0002 Reserved for concepts with insufficient information to code with codable children: Secondary | ICD-10-CM

## 2016-11-20 DIAGNOSIS — H547 Unspecified visual loss: Secondary | ICD-10-CM | POA: Diagnosis not present

## 2016-11-20 LAB — GRAM STAIN
MICRO NUMBER: 81078655
SPECIMEN QUALITY:: ADEQUATE

## 2016-11-20 NOTE — Discharge Instructions (Signed)

## 2016-11-20 NOTE — Progress Notes (Signed)
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  Opening pressure 20; down to 13 at closing.  Discharge instructions explained to patient and her mother, including the possibility of a low-pressure headache that would feel just like a "spinal headache," though for different reasons.  She understands to contact Dr. Terrace Arabia with any questions or concerns, especially a positional headache.  Donell Sievert, RN

## 2016-11-20 NOTE — Telephone Encounter (Signed)
I got stat lab result,  CSF: TP 49 mildly elevated, rest of the parameter was within normal limits

## 2016-11-22 LAB — CSF CELL COUNT WITH DIFFERENTIAL
RBC COUNT CSF: 1 {cells}/uL (ref 0–10)
WBC CSF: 0 {cells}/uL (ref 0–5)

## 2016-11-22 LAB — VDRL, CSF: SYPHILIS VDRL QUANT CSF: NONREACTIVE

## 2016-11-22 LAB — GLUCOSE, CSF: GLUCOSE CSF: 66 mg/dL (ref 40–80)

## 2016-11-22 LAB — PROTEIN, CSF: TOTAL PROTEIN, CSF: 49 mg/dL — AB (ref 15–45)

## 2016-11-23 ENCOUNTER — Telehealth: Payer: Self-pay | Admitting: Neurology

## 2016-11-23 NOTE — Telephone Encounter (Signed)
Laboratory evaluation showed open pressure of 23 cm of water on lumbar puncture, mild elevated total protein 49, rest of the laboratory evaluation showed no significant abnormality.

## 2016-11-23 NOTE — Telephone Encounter (Signed)
Rn call patient that the lab showed open pressure of 23cm of water on LP.It showed mild elevated total protein 49, rest of the laboratory evaluation showed no significant abnormality.PT has an appt with Dr. Terrace Arabia on 12/07/2016. Pt verbalized understanding.

## 2016-11-25 ENCOUNTER — Telehealth: Payer: Self-pay | Admitting: Neurology

## 2016-11-25 MED ORDER — ONDANSETRON HCL 4 MG PO TABS: 4.0000 mg | ORAL_TABLET | Freq: Three times a day (TID) | ORAL | 5 refills | Status: DC | PRN

## 2016-11-25 NOTE — Telephone Encounter (Signed)
Spoke to patient - reports migraine x 2 days.  She has only tried rizatriptan , 0.5 tablet once, two days ago.  She is home from work and feeling nauseated.  She has been instructed to take Maxalt, one tablet repeating in 2 hours if needed.  She may also take NSAIDS along with Benadryl.  Additionally, Dr. Terrace Arabia has provided her w/ a Zofran rx that has been sent to the pharmacy.  She was agreeable to try all home meds and rest to see if migraine will resolve.  She will call back if pain persist.

## 2016-11-25 NOTE — Telephone Encounter (Signed)
Pt called said HA's have gotten worse again yesterday.  Said she could not drive to work yesterday, had to get someone to drive her. Today she is having light, sound sensitivity, n&v. She is off work today.  Please call

## 2016-11-25 NOTE — Telephone Encounter (Signed)
Please call patient concerning her headaches, also asked her what home remedy she has use,

## 2016-11-25 NOTE — Addendum Note (Signed)
Addended by: Lindell Spar C on: 11/25/2016 11:21 AM   Modules accepted: Orders

## 2016-12-07 ENCOUNTER — Ambulatory Visit (INDEPENDENT_AMBULATORY_CARE_PROVIDER_SITE_OTHER): Payer: BLUE CROSS/BLUE SHIELD | Admitting: Neurology

## 2016-12-07 ENCOUNTER — Encounter: Payer: Self-pay | Admitting: Neurology

## 2016-12-07 VITALS — BP 115/64 | HR 53 | Ht 66.0 in | Wt 300.0 lb

## 2016-12-07 DIAGNOSIS — IMO0002 Reserved for concepts with insufficient information to code with codable children: Secondary | ICD-10-CM

## 2016-12-07 DIAGNOSIS — H4710 Unspecified papilledema: Secondary | ICD-10-CM | POA: Diagnosis not present

## 2016-12-07 DIAGNOSIS — G4710 Hypersomnia, unspecified: Secondary | ICD-10-CM | POA: Diagnosis not present

## 2016-12-07 DIAGNOSIS — G43709 Chronic migraine without aura, not intractable, without status migrainosus: Secondary | ICD-10-CM | POA: Diagnosis not present

## 2016-12-07 DIAGNOSIS — F331 Major depressive disorder, recurrent, moderate: Secondary | ICD-10-CM | POA: Diagnosis not present

## 2016-12-07 MED ORDER — PROPRANOLOL HCL ER 60 MG PO CP24: 60.0000 mg | ORAL_CAPSULE | Freq: Every day | ORAL | 11 refills | Status: DC

## 2016-12-07 MED ORDER — TOPIRAMATE 100 MG PO TABS: 100.0000 mg | ORAL_TABLET | Freq: Two times a day (BID) | ORAL | 11 refills | Status: DC

## 2016-12-07 NOTE — Progress Notes (Signed)
PATIENT: Jessica Fritz DOB: 01/14/1976  Chief Complaint  Patient presents with  . Optic papilla edema    She is taking Topamax , BID.  States she is still having daily headaches but the severity of her pain has improved.  She is still experiencing light sensitivity and has to use sunglasses most of the time. She would like to review her MRI and MRV results.     HISTORICAL  Jessica Fritz is a 41 years old right-handed female  seen in refer by her optometrist Dr.  Lorin Picket, Jon,for evaluation of right side papillary edema, initial evaluation was October 15 2016.   She had past medical history of obesity, wearing glasses for many years, has seen Dr. Lorin Picket for many years, during her most recent yearly checkup in August 2018, she was noted to have mild right papillary edema.  She reported long history of migraine headaches since high school, her typical migraine are severe lateralized pounding headache with associated light noise sensitivity, nauseous, lasting for a few hours, usually clustered around her menstruation period of time, since June 2018 she noticed increased headache, she also described transient blurry vision with sudden positional change such as bending over, most of her headache at right side now. She also complains of increased blurry vision  She has chronic obesity, there was no significant weight change.  Update December 07 2016: She tolerates topamax  2 tabs twice a day, still wake up daily with headaches, holocranial pressure headache, light sensitive, the worst headaches are often in the morning time, dizziness spells,   She had lumbar puncture November 20 2016, open pressure was 23 cm water, closing pressure was 14 cm H2O. spinal fluid testing showed total protein of 49, glucose of 66.  We have personally reviewed MRI of the brain, mild supratentorium small vessel disease, MRI of cervical spine, multilevel degenerative disc disease, most severe at C4-5, C5-6,  variable degree of foraminal narrowing, but there is no evidence of nerve roots or cervical cord compression.  She complains of excessive sleepiness, snoring, early morning headaches,  REVIEW OF SYSTEMS: Full 14 system review of systems performed and notable only for eye pain, blurry vision, fatigue, swelling legs, spinning sensation, depression anxiety too much sleep, disinteresting activity decreased energy  ALLERGIES: No Known Allergies  HOME MEDICATIONS: Current Outpatient Prescriptions  Medication Sig Dispense Refill  . albuterol (PROVENTIL) (2.5 MG/3ML) 0.083% nebulizer solution Take 2.5 mg by nebulization every 6 (six) hours as needed for wheezing or shortness of breath.    . Ascorbic Acid (VITAMIN C) 100 MG tablet Take 100 mg by mouth daily.    Marland Kitchen atenolol (TENORMIN) 25 MG tablet Take 25 mg by mouth daily.    . busPIRone (BUSPAR) 15 MG tablet Take 1 tablet (15 mg total) by mouth 2 (two) times daily. 180 tablet 0  . calcium carbonate (TUMS - DOSED IN MG ELEMENTAL CALCIUM) 500 MG chewable tablet Chew 2 tablets by mouth daily.      . citalopram (CELEXA) 40 MG tablet Take 1 tablet (40 mg total) by mouth daily. 90 tablet 0  . hydrOXYzine (ATARAX/VISTARIL) 50 MG tablet Take 1 tablet (50 mg total) by mouth at bedtime. 90 tablet 0  . Multiple Vitamin (MULTIVITAMIN) tablet Take 1 tablet by mouth daily.      . ondansetron (ZOFRAN) 4 MG tablet Take 1 tablet (4 mg total) by mouth every 8 (eight) hours as needed for nausea or vomiting. 30 tablet 5  . rizatriptan (MAXALT) 5 MG  tablet Take 1 tablet (5 mg total) by mouth as needed for migraine. May repeat in 2 hours if needed. Max: 2 tabs/24 hrs or 10 tabs/30 days. 10 tablet 5  . topiramate (TOPAMAX) 25 MG tablet Take 2 tablets (50 mg total) by mouth 2 (two) times daily. 120 tablet 3  . triamterene-hydrochlorothiazide (DYAZIDE) 37.5-25 MG capsule Take 1 capsule by mouth daily.    . vitamin B-12 (CYANOCOBALAMIN) 100 MCG tablet Take 100 mcg by mouth  daily.    . Vitamin D, Ergocalciferol, (DRISDOL) 50000 UNITS CAPS capsule Take 50,000 Units by mouth every 7 (seven) days.     No current facility-administered medications for this visit.     PAST MEDICAL HISTORY: Past Medical History:  Diagnosis Date  . Anxiety   . Depression   . Headache(784.0)   . Hypertension   . Migraine   . Papilledema, right eye   . Vitamin D deficiency     PAST SURGICAL HISTORY: Past Surgical History:  Procedure Laterality Date  . EYE SURGERY      FAMILY HISTORY: Family History  Problem Relation Age of Onset  . Alcohol abuse Paternal Grandfather   . Healthy Mother   . Diabetes Father   . Anxiety disorder Neg Hx   . Bipolar disorder Neg Hx   . Depression Neg Hx   . Suicidality Neg Hx     SOCIAL HISTORY:  Social History   Social History  . Marital status: Single    Spouse name: N/A  . Number of children: 0  . Years of education: some college   Occupational History  . Credit card collection for [employer]   Social History Main Topics  . Smoking status: Never Smoker  . Smokeless tobacco: Never Used  . Alcohol use Yes     Comment: occ  . Drug use: No  . Sexual activity: No   Other Topics Concern  . Not on file   Social History Narrative   Lives at home alone.   Right-handed.   3-4 cups caffeine per day.     PHYSICAL EXAM   Vitals:   12/07/16 1025  BP: 115/64  Pulse: (!) 53  Weight: 300 lb (136.1 kg)  Height:  (1.676 m)    Not recorded      Body mass index is 48.42 kg/m.  PHYSICAL EXAMNIATION:  Gen: NAD, conversant, well nourised, obese, well groomed                     Cardiovascular: Regular rate rhythm, no peripheral edema, warm, nontender. Eyes: Conjunctivae clear without exudates or hemorrhage Neck: Supple, no carotid bruits. Pulmonary: Clear to auscultation bilaterally   NEUROLOGICAL EXAM:  MENTAL STATUS: Speech:    Speech is normal; fluent and spontaneous with normal comprehension.    Cognition:     Orientation to time, place and person     Normal recent and remote memory     Normal Attention span and concentration     Normal Language, naming, repeating,spontaneous speech     Fund of knowledge   CRANIAL NERVES: CN II: Visual fields are full to confrontation.I was not able to visualize her fundi clearly  VI: extraocular movement are normal. No ptosis. CN V: Facial sensation is intact to pinprick in all 3 divisions bilaterally. Corneal responses are intact.  CN VII: Face is symmetric with normal eye closure and smile. CN VIII: Hearing is normal to rubbing fingers CN IX, X: Palate elevates symmetrically. Phonation  is normal. CN XI: Head turning and shoulder shrug are intact CN XII: Tongue is midline with normal movements and no atrophy.  MOTOR: There is no pronator drift of out-stretched arms. Muscle bulk and tone are normal. Muscle strength is normal.  REFLEXES: Reflexes are 2+ and symmetric at the biceps, triceps, knees, and ankles. Plantar responses are flexor.  SENSORY: Intact to light touch, pinprick, positional sensation and vibratory sensation are intact in fingers and toes.  COORDINATION: Rapid alternating movements and fine finger movements are intact. There is no dysmetria on finger-to-nose and heel-knee-shin.    GAIT/STANCE: Posture is normal. Gait is steady with normal steps, base, arm swing, and turning. Heel and toe walking are normal. Tandem gait is normal.  Romberg is absent.   DIAGNOSTIC DATA (LABS, IMAGING, TESTING) - I reviewed patient records, labs, notes, testing and imaging myself where available.   ASSESSMENT AND PLAN  Jessica Fritz is a 41 y.o. female   Possible pseudotumor cerebri  She certainly is at increased risk for pseudotumor cerebri, with her obesity,  Topamax titrating to 100 twice a day    Chronic migraine headache  Maxalt as needed  Change atenolol to propanolol  Possible obstructive sleep apnea  She has  excessive daytime sleepiness, snoring,  Referral to sleep study    Levert Feinstein, M.D. Ph.D.  [facility] [address] [place], [place] [address] Ph: [phone] Fax: [phone]  CC: Fredrich Birks, OD

## 2016-12-07 NOTE — Telephone Encounter (Addendum)
Note from 11/25/16:   Spoke to patient - reports migraine x 2 days.  She has only tried rizatriptan , 0.5 tablet once, two days ago.  She is home from work and feeling nauseated.  She has been instructed to take Maxalt, one tablet repeating in 2 hours if needed.  She may also take NSAIDS along with Benadryl.  Additionally, Dr. Terrace Arabia has provided her w/ a Zofran rx that has been sent to the pharmacy.  She was agreeable to try all home meds and rest to see if migraine will resolve.  She will call back if pain persist.

## 2016-12-10 ENCOUNTER — Encounter (HOSPITAL_COMMUNITY): Payer: Self-pay | Admitting: Psychiatry

## 2016-12-10 ENCOUNTER — Ambulatory Visit (INDEPENDENT_AMBULATORY_CARE_PROVIDER_SITE_OTHER): Payer: BLUE CROSS/BLUE SHIELD | Admitting: Psychiatry

## 2016-12-10 DIAGNOSIS — M549 Dorsalgia, unspecified: Secondary | ICD-10-CM

## 2016-12-10 DIAGNOSIS — R42 Dizziness and giddiness: Secondary | ICD-10-CM

## 2016-12-10 DIAGNOSIS — R51 Headache: Secondary | ICD-10-CM

## 2016-12-10 DIAGNOSIS — F331 Major depressive disorder, recurrent, moderate: Secondary | ICD-10-CM | POA: Diagnosis not present

## 2016-12-10 DIAGNOSIS — F411 Generalized anxiety disorder: Secondary | ICD-10-CM

## 2016-12-10 DIAGNOSIS — M542 Cervicalgia: Secondary | ICD-10-CM

## 2016-12-10 DIAGNOSIS — R4584 Anhedonia: Secondary | ICD-10-CM | POA: Diagnosis not present

## 2016-12-10 DIAGNOSIS — F99 Mental disorder, not otherwise specified: Secondary | ICD-10-CM | POA: Diagnosis not present

## 2016-12-10 DIAGNOSIS — R450 Nervousness: Secondary | ICD-10-CM

## 2016-12-10 DIAGNOSIS — R4583 Excessive crying of child, adolescent or adult: Secondary | ICD-10-CM

## 2016-12-10 DIAGNOSIS — F5105 Insomnia due to other mental disorder: Secondary | ICD-10-CM

## 2016-12-10 DIAGNOSIS — Z811 Family history of alcohol abuse and dependence: Secondary | ICD-10-CM | POA: Diagnosis not present

## 2016-12-10 DIAGNOSIS — R5383 Other fatigue: Secondary | ICD-10-CM

## 2016-12-10 MED ORDER — BUSPIRONE HCL 15 MG PO TABS: 15.0000 mg | ORAL_TABLET | Freq: Two times a day (BID) | ORAL | 0 refills | Status: DC

## 2016-12-10 MED ORDER — HYDROXYZINE HCL 50 MG PO TABS: 50.0000 mg | ORAL_TABLET | Freq: Every day | ORAL | 0 refills | Status: DC

## 2016-12-10 MED ORDER — CITALOPRAM HYDROBROMIDE 40 MG PO TABS: 40.0000 mg | ORAL_TABLET | Freq: Every day | ORAL | 0 refills | Status: DC

## 2016-12-10 NOTE — Progress Notes (Signed)
BH MD/PA/NP OP Progress Note  12/10/2016 10:41 AM Jessica Fritz  MRN:  [id number]  Chief Complaint:  Chief Complaint    Follow-up     HPI: Pt is working with a neurologist regarding her swollen optic nerve.  Depression and anxiety are a little worse due to stressors. Pt states she is always tired and she is able to fall asleep while working, talking on the phone or sitting. Pt is getting about 6-7 hrs of broken sleep at night. Pt is having worsening depression due to 1st anniversary of her friend's death next week, worsening health issues and her birthday coming up. Pt reports ongoing low motivation, anhedonia, worthlessness and some crying spells. She is spending all her free time in bed. Pt denies SI/HI.AVH. Her parents are here to help out for a little while.  Anxiety is very high and is the worst it has ever been. Pt reports racing thoughts and always focused on the negative.  Pt states-taking meds as prescribed and denies SE. Pt thinks meds are helping and doesn't want meds changed at this time.   Visit Diagnosis:    ICD-10-CM   1. Major depressive disorder, recurrent episode, moderate (HCC) F33.1 busPIRone (BUSPAR) 15 MG tablet    citalopram (CELEXA) 40 MG tablet    hydrOXYzine (ATARAX/VISTARIL) 50 MG tablet  2. GAD (generalized anxiety disorder) F41.1 busPIRone (BUSPAR) 15 MG tablet    citalopram (CELEXA) 40 MG tablet    hydrOXYzine (ATARAX/VISTARIL) 50 MG tablet  3. Insomnia due to other mental disorder F51.05 hydrOXYzine (ATARAX/VISTARIL) 50 MG tablet   F99     Past Psychiatric History:  Anxiety:Yes Bipolar Disorder:No Depression:Yes Mania:No Psychosis:No Schizophrenia:No Personality Disorder:No Hospitalization for psychiatric illness:No History of Electroconvulsive Shock Therapy:No Prior Suicide Attempts:No  Past Medical History:  Past Medical History:  Diagnosis Date  . Anxiety   . Depression   . Headache(784.0)   . Hypertension   . Migraine    . Papilledema, right eye   . Vitamin D deficiency     Past Surgical History:  Procedure Laterality Date  . EYE SURGERY      Family Psychiatric History:  Family History  Problem Relation Age of Onset  . Alcohol abuse Paternal Grandfather   . Healthy Mother   . Diabetes Father   . Anxiety disorder Neg Hx   . Bipolar disorder Neg Hx   . Depression Neg Hx   . Suicidality Neg Hx     Social History:  Social History   Social History  . Marital status: Single    Spouse name: N/A  . Number of children: 0  . Years of education: some college   Social History Main Topics  . Smoking status: Never Smoker  . Smokeless tobacco: Never Used  . Alcohol use Yes     Comment: occ  . Drug use: No  . Sexual activity: No   Other Topics Concern  . None   Social History Narrative   Lives at home alone.   Right-handed.   3-4 cups caffeine per day.    Allergies: No Known Allergies  Metabolic Disorder Labs: No results found for: HGBA1C, MPG No results found for: PROLACTIN No results found for: CHOL, TRIG, HDL, CHOLHDL, VLDL, LDLCALC No results found for: TSH  Therapeutic Level Labs: No results found for: LITHIUM No results found for: VALPROATE No components found for:  CBMZ  Current Medications: Current Outpatient Prescriptions  Medication Sig Dispense Refill  . albuterol (PROVENTIL) (2.5 MG/3ML) 0.083% nebulizer solution Take  2.5 mg by nebulization every 6 (six) hours as needed for wheezing or shortness of breath.    . Ascorbic Acid (VITAMIN C) 100 MG tablet Take 100 mg by mouth daily.    . busPIRone (BUSPAR) 15 MG tablet Take 1 tablet (15 mg total) by mouth 2 (two) times daily. 180 tablet 0  . calcium carbonate (TUMS - DOSED IN MG ELEMENTAL CALCIUM) 500 MG chewable tablet Chew 2 tablets by mouth daily.      . citalopram (CELEXA) 40 MG tablet Take 1 tablet (40 mg total) by mouth daily. 90 tablet 0  . hydrOXYzine (ATARAX/VISTARIL) 50 MG tablet Take 1 tablet (50 mg total) by  mouth at bedtime. 90 tablet 0  . Multiple Vitamin (MULTIVITAMIN) tablet Take 1 tablet by mouth daily.      . ondansetron (ZOFRAN) 4 MG tablet Take 1 tablet (4 mg total) by mouth every 8 (eight) hours as needed for nausea or vomiting. 30 tablet 5  . propranolol ER (INDERAL LA) 60 MG 24 hr capsule Take 1 capsule (60 mg total) by mouth daily. 30 capsule 11  . rizatriptan (MAXALT) 5 MG tablet Take 1 tablet (5 mg total) by mouth as needed for migraine. May repeat in 2 hours if needed. Max: 2 tabs/24 hrs or 10 tabs/30 days. 10 tablet 5  . topiramate (TOPAMAX) 100 MG tablet Take 1 tablet (100 mg total) by mouth 2 (two) times daily. 60 tablet 11  . triamterene-hydrochlorothiazide (DYAZIDE) 37.5-25 MG capsule Take 1 capsule by mouth daily.    . vitamin B-12 (CYANOCOBALAMIN) 100 MCG tablet Take 100 mcg by mouth daily.    . Vitamin D, Ergocalciferol, (DRISDOL) 50000 UNITS CAPS capsule Take 50,000 Units by mouth every 7 (seven) days.     No current facility-administered medications for this visit.      Musculoskeletal: Strength & Muscle Tone: within normal limits Gait & Station: normal Patient leans: N/A  Psychiatric Specialty Exam: Review of Systems  Musculoskeletal: Positive for back pain and neck pain. Negative for falls and joint pain.  Neurological: Positive for dizziness and headaches. Negative for tingling, tremors and speech change.  Psychiatric/Behavioral: Positive for depression. The patient is nervous/anxious. The patient does not have insomnia.     Blood pressure 132/78, pulse 66, height 5\' 6"  (1.676 m), weight (!) 300 lb 12.8 oz (136.4 kg).Body mass index is 48.55 kg/m.  General Appearance: Fairly Groomed  Eye Contact:  Poor  Speech:  Clear and Coherent and Normal Rate  Volume:  Normal  Mood:  Anxious and Depressed  Affect:  Congruent  Thought Process:  Goal Directed and Descriptions of Associations: Intact  Orientation:  Full (Time, Place, and Person)  Thought Content: Logical    Suicidal Thoughts:  No  Homicidal Thoughts:  No  Memory:  Immediate;   Good Recent;   Good Remote;   Good  Judgement:  Good  Insight:  Good  Psychomotor Activity:  Normal  Concentration:  Concentration: Good and Attention Span: Good  Recall:  Good  Fund of Knowledge: Good  Language: Good  Akathisia:  No  Handed:  Right  AIMS (if indicated): not done  Assets:  Communication Skills Desire for Improvement Housing Social Support  ADL's:  Intact  Cognition: WNL  Sleep:  Poor   Screenings:   Assessment and Plan: MDD-recurrent, moderate; GAD; Insomnia   Medication management with supportive therapy. Risks/benefits and SE of the medication discussed. Pt verbalized understanding and verbal consent obtained for treatment.  Affirm with the patient  that the medications are taken as ordered. Patient expressed understanding of how their medications were to be used.   Meds: Celexa 40mg  po qHS for depression and anxiety Buspar 15mg  po BID for anxiety Vistaril 50mg  po qHS prn insomnia   Labs: none  Therapy: brief supportive therapy provided. Discussed psychosocial stressors in detail.     Consultations: Encouraged to follow up with PCP as needed  Pt denies SI and is at an acute low risk for suicide. Patient told to call clinic if any problems occur. Patient advised to go to ER if they should develop SI/HI, side effects, or if symptoms worsen. Has crisis numbers to call if needed. Pt verbalized understanding.  F/up in 2 months or sooner if needed   Oletta DarterSalina Erland Vivas, MD 12/10/2016, 10:41 AM

## 2016-12-22 ENCOUNTER — Telehealth: Payer: Self-pay | Admitting: Neurology

## 2016-12-22 LAB — FUNGUS CULTURE W SMEAR
MICRO NUMBER:: 81078656
SMEAR:: NONE SEEN
SPECIMEN QUALITY:: ADEQUATE

## 2016-12-22 NOTE — Telephone Encounter (Signed)
Spoke to patient she is aware of results

## 2016-12-22 NOTE — Telephone Encounter (Signed)
Please call patient, lumbar puncture showed mildly increased open pressure of 23 cm H2O, csf showed no significant abnormalities.

## 2016-12-30 ENCOUNTER — Telehealth: Payer: Self-pay | Admitting: Neurology

## 2016-12-30 NOTE — Telephone Encounter (Signed)
She can take a home cocktail of Maxalt, zofran, aleve, benadryl and go to sleep for her headache

## 2016-12-30 NOTE — Telephone Encounter (Signed)
I spoke with patient and made her aware of Dr. Zannie CoveYan's recommendation. She voiced understanding and appreciation. She will go to ED if symptoms worsen or change or will call back if symptoms no better tomorrow.

## 2016-12-30 NOTE — Telephone Encounter (Signed)
I spoke with patient and she is c/o tenderness on the top of her head along with a very bad headache since yesterday. She has been taking the topamax and the propranolol. She had a biometric screening at work yesterday and her blood pressure was 140/88. She stayed home today because her headache made her feel to bad. Do you have any recommendations or suggestions?

## 2016-12-30 NOTE — Telephone Encounter (Signed)
Patient has had a really bad headache since yesterday and wants to come in to see Dr. Terrace ArabiaYan. She has taken medication Dr. Terrace ArabiaYan has prescribed and has not helped. She does not remember name of medication.Please call and discuss.

## 2017-01-07 ENCOUNTER — Telehealth: Payer: Self-pay | Admitting: *Deleted

## 2017-01-07 NOTE — Telephone Encounter (Signed)
Pt [employer] form on Eriyanna desk. 

## 2017-01-11 DIAGNOSIS — Z0289 Encounter for other administrative examinations: Secondary | ICD-10-CM

## 2017-01-11 NOTE — Telephone Encounter (Signed)
LMVM for pt to return call [facility].

## 2017-01-12 NOTE — Telephone Encounter (Signed)
Paperwork signed and placed in pick up bin to be returned to WolbachDebra in medical records.

## 2017-01-12 NOTE — Telephone Encounter (Signed)
Spoke to pt and she relayed that she was out last week for 3 days due to migraine.  She says that she has 50% decrease 15-20 per month to 8-9 per month.  Completed and forwarded to Dr. Terrace ArabiaYan for review and signature.

## 2017-01-12 NOTE — Telephone Encounter (Signed)
Patient's chart reviewed.  She has rescue medication, if needed, to treat active migraines. Her medications will allow her to work during some of her episodes.  Time provided on FMLA: 2-3 episodes per month, lasting 1-2 days per episode to provide time off for the more significant migraines. If she needs more time off then we need to schedule her an earlier follow up so Dr. Terrace ArabiaYan can further discuss her care.

## 2017-01-12 NOTE — Telephone Encounter (Signed)
[facility] and is asking for a call back re: FMLA forms

## 2017-03-08 NOTE — Progress Notes (Signed)
[facility]  PATIENT: Jessica Fritz DOB: 03/18/1975   REASON FOR VISIT: Follow-up for acute migraine today HISTORY FROM: Patient    HISTORY OF PRESENT ILLNESS: Jessica Fritz is a 42 years old right-handed female  seen in refer by her optometrist Dr.  Lorin Picket, Jon,for evaluation of right side papillary edema, initial evaluation was October 15 2016.   She had past medical history of obesity, wearing glasses for many years, has seen Dr. Lorin Picket for many years, during her most recent yearly checkup in August 2018, she was noted to have mild right papillary edema.  She reported long history of migraine headaches since high school, her typical migraine are severe lateralized pounding headache with associated light noise sensitivity, nauseous, lasting for a few hours, usually clustered around her menstruation period of time, since June 2018 she noticed increased headache, she also described transient blurry vision with sudden positional change such as bending over, most of her headache at right side now. She also complains of increased blurry vision  She has chronic obesity, there was no significant weight change.  Update December 07 2016:YYShe tolerates topamax 25mg  2 tabs twice a day, still wake up daily with headaches, holocranial pressure headache, light sensitive, the worst headaches are often in the morning time, dizziness spells,  She had lumbar puncture November 20 2016, open pressure was 23 cm water, closing pressure was 14 cm H2O. spinal fluid testing showed total protein of 49, glucose of 66. We have personally reviewed MRI of the brain, mild supratentorium small vessel disease, MRI of cervical spine, multilevel degenerative disc disease, most severe at C4-5, C5-6, variable degree of foraminal narrowing, but there is no evidence of nerve roots or cervical cord compression. She complains of excessive sleepiness, snoring, early morning headaches,  UPDATE  1/15/2019CM  Jessica Fritz, 42 year old female returns for follow-up with history of headaches, morbid obesity and probable sleep apnea.  Sleep study was ordered at her last visit however this is not been done.  Patient never got a call.  She has a headache today which she rates on a pain scale of 1-10 as a 6 to 7.  She would like to get some medication for this if  possible so that she can go to work.  She claims she has missed a lot of work.  She is nauseated but has not vomited.  She is currently on atenolol 60 mg LA daily and Topamax 100 twice daily.  She has Maxalt to take acutely.  LP repeated the last of October.  Opening pressure 23.  No significant abnormality in  CSF fluid.  She returns for reevaluation REVIEW OF SYSTEMS: Full 14 system review of systems performed and notable only for those listed, all others are neg:  Constitutional: neg  Cardiovascular: neg Ear/Nose/Throat: neg  Skin: neg Eyes: Light sensitivity, blurred vision Respiratory: neg Gastroitestinal: neg  Hematology/Lymphatic: neg  Endocrine: neg Musculoskeletal:neg Allergy/Immunology: neg Neurological: Headache dizziness Psychiatric: Depression anxiety Sleep :  daytime sleepiness, snoring   ALLERGIES: No Known Allergies  HOME MEDICATIONS: Outpatient Medications Prior to Visit  Medication Sig Dispense Refill  . albuterol (PROVENTIL) (2.5 MG/3ML) 0.083% nebulizer solution Take 2.5 mg by nebulization every 6 (six) hours as needed for wheezing or shortness of breath.    . Ascorbic Acid (VITAMIN C) 100 MG tablet Take 100 mg by mouth daily.    . busPIRone (BUSPAR) 15 MG tablet Take 1 tablet (15 mg total) by mouth 2 (two) times daily. 180 tablet 0  .  calcium carbonate (TUMS - DOSED IN MG ELEMENTAL CALCIUM) 500 MG chewable tablet Chew 2 tablets by mouth daily.      . citalopram (CELEXA) 40 MG tablet Take 1 tablet (40 mg total) by mouth daily. 90 tablet 0  . hydrOXYzine (ATARAX/VISTARIL) 50 MG tablet Take 1 tablet (50 mg  total) by mouth at bedtime. 90 tablet 0  . Multiple Vitamin (MULTIVITAMIN) tablet Take 1 tablet by mouth daily.      . ondansetron (ZOFRAN) 4 MG tablet Take 1 tablet (4 mg total) by mouth every 8 (eight) hours as needed for nausea or vomiting. 30 tablet 5  . propranolol ER (INDERAL LA) 60 MG 24 hr capsule Take 1 capsule (60 mg total) by mouth daily. 30 capsule 11  . rizatriptan (MAXALT) 5 MG tablet Take 1 tablet (5 mg total) by mouth as needed for migraine. May repeat in 2 hours if needed. Max: 2 tabs/24 hrs or 10 tabs/30 days. 10 tablet 5  . topiramate (TOPAMAX) 100 MG tablet Take 1 tablet (100 mg total) by mouth 2 (two) times daily. 60 tablet 11  . triamterene-hydrochlorothiazide (DYAZIDE) 37.5-25 MG capsule Take 1 capsule by mouth daily.    . vitamin B-12 (CYANOCOBALAMIN) 100 MCG tablet Take 100 mcg by mouth daily.    . Vitamin D, Ergocalciferol, (DRISDOL) 50000 UNITS CAPS capsule Take 50,000 Units by mouth every 7 (seven) days.     No facility-administered medications prior to visit.     PAST MEDICAL HISTORY: Past Medical History:  Diagnosis Date  . Anxiety   . Depression   . Headache(784.0)   . Hypertension   . Migraine   . Papilledema, right eye   . Vitamin D deficiency     PAST SURGICAL HISTORY: Past Surgical History:  Procedure Laterality Date  . EYE SURGERY      FAMILY HISTORY: Family History  Problem Relation Age of Onset  . Alcohol abuse Paternal Grandfather   . Healthy Mother   . Diabetes Father   . Anxiety disorder Neg Hx   . Bipolar disorder Neg Hx   . Depression Neg Hx   . Suicidality Neg Hx     SOCIAL HISTORY: Social History   Socioeconomic History  . Marital status: Single    Spouse name: Not on file  . Number of children: 0  . Years of education: some college  . Highest education level: Not on file  Social Needs  . Financial resource strain: Not on file  . Food insecurity - worry: Not on file  . Food insecurity - inability: Not on file  .  Transportation needs - medical: Not on file  . Transportation needs - non-medical: Not on file  Occupational History  . Not on file  Tobacco Use  . Smoking status: Never Smoker  . Smokeless tobacco: Never Used  Substance and Sexual Activity  . Alcohol use: Yes    Comment: occ  . Drug use: No  . Sexual activity: No    Birth control/protection: None  Other Topics Concern  . Not on file  Social History Narrative   Lives at home alone.   Right-handed.   3-4 cups caffeine per day.     PHYSICAL EXAM  Vitals:   03/09/17 0959  BP: 137/87  Pulse: (!) 56  Weight: 300 lb (136.1 kg)   Body mass index is 48.42 kg/m.  Generalized: Well developed, morbidly obese female in no acute distress  Head: normocephalic and atraumatic,. Oropharynx benign  Neck: Supple,  Musculoskeletal: No deformity   Neurological examination   Mentation: Alert oriented to time, place, history taking. Attention span and concentration appropriate. Recent and remote memory intact.  Follows all commands speech and language fluent.   Cranial nerve II-XII: Fundoscopic exam not done due to migraine Pupils were equal round reactive to light extraocular movements were full, visual field were full on confrontational test. Facial sensation and strength were normal. hearing was intact to finger rubbing bilaterally. Uvula tongue midline. head turning and shoulder shrug were normal and symmetric.Tongue protrusion into cheek strength was normal. Motor: normal bulk and tone, full strength in the BUE, BLE, Sensory: normal and symmetric to light touch,  Coordination: finger-nose-finger, heel-to-shin bilaterally, no dysmetria Reflexes: Symmetric upper and lower plantar responses were flexor bilaterally. Gait and Station: Rising up from seated position without assistance, normal stance,  moderate stride, good arm swing, smooth turning, able to perform tiptoe, and heel walking without difficulty. Tandem gait is  steady  DIAGNOSTIC DATA (LABS, IMAGING, TESTING) - I reviewed patient records, labs, notes, testing and imaging myself where available.  Lab Results  Component Value Date   WBC 7.1 06/24/2016   HGB 11.1 (L) 06/24/2016   HCT 31.7 (L) 06/24/2016   MCV 86.4 06/24/2016   PLT 295 06/24/2016      Component Value Date/Time   NA 136 06/24/2016 1836   K 3.6 06/24/2016 1836   CL 103 06/24/2016 1836   CO2 27 06/24/2016 1836   GLUCOSE 98 06/24/2016 1836   BUN 9 06/24/2016 1836   CREATININE 0.74 06/24/2016 1836   CALCIUM 8.6 (L) 06/24/2016 1836   PROT 7.4 09/09/2007 0949   ALBUMIN 4.3 09/09/2007 0949   AST 27 09/09/2007 0949   ALT 27 09/09/2007 0949   ALKPHOS 45 09/09/2007 0949   BILITOT 0.7 09/09/2007 0949   GFRNONAA >60 06/24/2016 1836   GFRAA >60 06/24/2016 1836    ASSESSMENT AND PLAN Jessica Fritz is a 42 y.o. female   with migraine headaches which are chronic.  She has a migraine today is a 7 on a pain scale of 1-10.  Possible pseudotumor cerebri however most recent LP opening pressure 23 and CSF within normal limits.  Possible obstructive sleep apnea she was never called for sleep study.  She has a excessive daytime sleepiness and snoring.                   PLAN:  IV Depacon today for headache Continue Topamax 100 mg twice daily Maxalt as needed as needed Continue propanolol 60 mg daily Will get sleep study Follow-up in 4 months  Nilda RiggsNancy Carolyn Martin, GNP, [facility], APRN Late entry, after Depacon infusion migraine came down from a 7 to a 2 on the pain scale. [facility] [address], [address] [place], [place] [id number] [phone]

## 2017-03-09 ENCOUNTER — Ambulatory Visit: Payer: BLUE CROSS/BLUE SHIELD | Admitting: Nurse Practitioner

## 2017-03-09 ENCOUNTER — Encounter: Payer: Self-pay | Admitting: Nurse Practitioner

## 2017-03-09 VITALS — BP 137/87 | HR 56 | Wt 300.0 lb

## 2017-03-09 DIAGNOSIS — G43709 Chronic migraine without aura, not intractable, without status migrainosus: Secondary | ICD-10-CM | POA: Diagnosis not present

## 2017-03-09 DIAGNOSIS — IMO0002 Reserved for concepts with insufficient information to code with codable children: Secondary | ICD-10-CM

## 2017-03-09 DIAGNOSIS — G4710 Hypersomnia, unspecified: Secondary | ICD-10-CM

## 2017-03-09 NOTE — Patient Instructions (Signed)
V Depacon today for headache Continue Topamax 100 mg twice daily Maxalt as needed as needed Continue propanolol 60 mg daily Will get sleep study Follow-up in 4 months

## 2017-03-09 NOTE — Progress Notes (Signed)
I have reviewed and agreed above plan. 

## 2017-03-10 ENCOUNTER — Telehealth: Payer: Self-pay | Admitting: Nurse Practitioner

## 2017-03-10 NOTE — Telephone Encounter (Signed)
Patient calling to discuss being out of work because of migraines.

## 2017-03-10 NOTE — Telephone Encounter (Signed)
Attempted to reach patient. Phone continuously rang.

## 2017-03-11 NOTE — Telephone Encounter (Signed)
Pt is calling again wanting to discuss being out of work for migraines. Contact at [phone]

## 2017-03-11 NOTE — Telephone Encounter (Addendum)
Called patient to discuss FMLA and advised her that Eber JonesCarolyn had told her to have new FMLA papers brought in. The patient stated Dr Terrace ArabiaYan filled out FMLA last Oct, and she was told that line 6 could be changed. She stated her current FMLA states 2 days per week, 3 days/monthly. She is unsure how much time she needs but stated she cannot work regularly with the way she is currently feeling.  She stated that her current FMLA can be changed; this RN stated the FMLA from Oct is not in her chart. Advised will send to Dr Zannie CoveYan's RN who may have a copy. Advised patient she will be contacted re: if FMLA papers can be changed. Also advised her that Eber JonesCarolyn stated she needed to complete her work up, have sleep study. Patient stated she is scheduled for tomorrow; this RN advised that is her consultation. The sleep dr will determine if she needs sleep study, and advised her that if sleep apnea is found, that could be contributing to her headaches as well. Patient verbalized understanding, appreciation for call.

## 2017-03-11 NOTE — Telephone Encounter (Signed)
Per Enid Skeens Martin, NP, FMLA extended until patient has sleep consult next week. FMLA papers [facility]ed [facility]. Called patient and informed her of this change. She verbalized understanding, appreciation.

## 2017-03-16 ENCOUNTER — Ambulatory Visit: Payer: BLUE CROSS/BLUE SHIELD | Admitting: Neurology

## 2017-03-16 ENCOUNTER — Encounter: Payer: Self-pay | Admitting: Neurology

## 2017-03-16 VITALS — BP 136/74 | HR 57 | Ht 66.0 in | Wt 301.0 lb

## 2017-03-16 DIAGNOSIS — Z6841 Body Mass Index (BMI) 40.0 and over, adult: Secondary | ICD-10-CM | POA: Diagnosis not present

## 2017-03-16 DIAGNOSIS — R351 Nocturia: Secondary | ICD-10-CM

## 2017-03-16 DIAGNOSIS — R51 Headache: Secondary | ICD-10-CM

## 2017-03-16 DIAGNOSIS — R519 Headache, unspecified: Secondary | ICD-10-CM

## 2017-03-16 DIAGNOSIS — R0683 Snoring: Secondary | ICD-10-CM

## 2017-03-16 DIAGNOSIS — G4719 Other hypersomnia: Secondary | ICD-10-CM | POA: Diagnosis not present

## 2017-03-16 NOTE — Progress Notes (Signed)
Subjective:    Patient ID: Jessica Fritz is a 42 y.o. female.  HPI     Huston Foley, MD, PhD [facility] [address], [address] [address] [place], [place] [address]  Dear Jessica Fritz,   I saw your patient, Jessica Fritz, upon your kind request in my clinic today for initial consultation of her sleep disorder, in particular, concern for underlying obstructive sleep apnea. The patient is unaccompanied today. As you know, Ms. Doring is a 42 year old right-handed woman with an underlying medical history of migraine headaches, papilledema, hypertension, depression, anxiety, vitamin D deficiency, and morbid obesity with a BMI of over 45, who reports snoring and excessive daytime somnolence. I reviewed your office note from 12/07/2016. Her Epworth sleepiness score is 19 out of 24 today, fatigue score is 58 out of 63. She has no FHx of OSA, reports morning headaches, and nocturia about 2 times per night. 1 PM to 10 PM typically. Bedtime is around midnight and wake up time around 8:30. She is single, no children, has 2 dogs. Dr. not typically in her bedroom, she does not typically watch TV in her bedroom but does use her tablet at night. She denies any telltale symptoms of restless leg syndrome. She has woken up with a sense of gasping. Her family and previous roommates have told her that she snores. She is a nonsmoker and drinks caffeine in the form of tea and soda, 3-4 servings per day, drinks alcohol occasionally. She works in collections.  Her Past Medical History Is Significant For: Past Medical History:  Diagnosis Date  . Anxiety   . Depression   . Headache(784.0)   . Hypertension   . Migraine   . Papilledema, right eye   . Vitamin D deficiency     Her Past Surgical History Is Significant For: Past Surgical History:  Procedure Laterality Date  . EYE SURGERY      Her Family History Is Significant For: Family History  Problem Relation Age of Onset  . Alcohol abuse  Paternal Grandfather   . Healthy Mother   . Diabetes Father   . Anxiety disorder Neg Hx   . Bipolar disorder Neg Hx   . Depression Neg Hx   . Suicidality Neg Hx     Her Social History Is Significant For: Social History   Socioeconomic History  . Marital status: Single    Spouse name: None  . Number of children: 0  . Years of education: some college  . Highest education level: None  Social Needs  . Financial resource strain: None  . Food insecurity - worry: None  . Food insecurity - inability: None  . Transportation needs - medical: None  . Transportation needs - non-medical: None  Occupational History  . None  Tobacco Use  . Smoking status: Never Smoker  . Smokeless tobacco: Never Used  Substance and Sexual Activity  . Alcohol use: Yes    Comment: occ  . Drug use: No  . Sexual activity: No    Birth control/protection: None  Other Topics Concern  . None  Social History Narrative   Lives at home alone.   Right-handed.   3-4 cups caffeine per day.    Her Allergies Are:  No Known Allergies:   Her Current Medications Are:  Outpatient Encounter Medications as of 03/16/2017  Medication Sig  . albuterol (PROVENTIL) (2.5 MG/3ML) 0.083% nebulizer solution Take 2.5 mg by nebulization every 6 (six) hours as needed for wheezing or shortness of breath.  Marland Kitchen  Ascorbic Acid (VITAMIN C) 100 MG tablet Take 100 mg by mouth daily.  . busPIRone (BUSPAR) 15 MG tablet Take 1 tablet (15 mg total) by mouth 2 (two) times daily.  . calcium carbonate (TUMS - DOSED IN MG ELEMENTAL CALCIUM) 500 MG chewable tablet Chew 2 tablets by mouth daily.    . citalopram (CELEXA) 40 MG tablet Take 1 tablet (40 mg total) by mouth daily.  . hydrOXYzine (ATARAX/VISTARIL) 50 MG tablet Take 1 tablet (50 mg total) by mouth at bedtime.  . Multiple Vitamin (MULTIVITAMIN) tablet Take 1 tablet by mouth daily.    . ondansetron (ZOFRAN) 4 MG tablet Take 1 tablet (4 mg total) by mouth every 8 (eight) hours as needed  for nausea or vomiting.  . propranolol ER (INDERAL LA) 60 MG 24 hr capsule Take 1 capsule (60 mg total) by mouth daily.  . rizatriptan (MAXALT) 5 MG tablet Take 1 tablet (5 mg total) by mouth as needed for migraine. May repeat in 2 hours if needed. Max: 2 tabs/24 hrs or 10 tabs/30 days.  Marland Kitchen topiramate (TOPAMAX) 100 MG tablet Take 1 tablet (100 mg total) by mouth 2 (two) times daily.  Marland Kitchen triamterene-hydrochlorothiazide (DYAZIDE) 37.5-25 MG capsule Take 1 capsule by mouth daily.  . vitamin B-12 (CYANOCOBALAMIN) 100 MCG tablet Take 100 mcg by mouth daily.  . Vitamin D, Ergocalciferol, (DRISDOL) 50000 UNITS CAPS capsule Take 50,000 Units by mouth every 7 (seven) days.   No facility-administered encounter medications on file as of 03/16/2017.   :  Review of Systems:  Out of a complete 14 point review of systems, all are reviewed and negative with the exception of these symptoms as listed below: Review of Systems  Neurological:       Patient being referred by Dr. Terrace Arabia for excessive sleepiness.   Epworth Sleepiness Scale 0= would never doze 1= slight chance of dozing 2= moderate chance of dozing 3= high chance of dozing  Sitting and reading: 3 Watching TV:2 Sitting inactive in a public place (ex. Theater or meeting): 3 As a passenger in a car for an hour without a break: 2 Lying down to rest in the afternoon: 3 Sitting and talking to someone:2 Sitting quietly after lunch (no alcohol): 3 In a car, while stopped in traffic:1 Total: 19     Objective:  Neurological Exam  Physical Exam Physical Examination:   Vitals:   03/16/17 1601  BP: 136/74  Pulse: (!) 57    General Examination: The patient is a very pleasant 42 y.o. female in no acute distress. She appears well-developed and well-nourished and well groomed.   HEENT: Normocephalic, atraumatic, pupils are equal, round and reactive to light and accommodation. Extraocular tracking is good without limitation to gaze excursion or  nystagmus noted. Normal smooth pursuit is noted. Hearing is grossly intact. Tympanic membranes are clear bilaterally. Face is symmetric with normal facial animation and normal facial sensation. Speech is clear with no dysarthria noted. There is no hypophonia. There is no lip, neck/head, jaw or voice tremor. Neck is supple with full range of passive and active motion. There are no carotid bruits on auscultation. Oropharynx exam reveals: mild mouth dryness, adequate dental hygiene and mild airway crowding, due to smaller airway opening, also smaller mouth opening, tonsils of 1+. Mallampati is class III. Tongue protrudes centrally and palate elevates symmetrically. Tonsils are Neck size is 17.5 inches. She has a Mild overbite.   Chest: Clear to auscultation without wheezing, rhonchi or crackles noted.  Heart: S1+S2+0,  regular and normal without murmurs, rubs or gallops noted.   Abdomen: Soft, non-tender and non-distended with normal bowel sounds appreciated on auscultation.  Extremities: There is trace pitting edema in the distal lower extremities bilaterally. Pedal pulses are intact.  Skin: Warm and dry without trophic changes noted.  Musculoskeletal: exam reveals no obvious joint deformities, tenderness or joint swelling or erythema.   Neurologically:  Mental status: The patient is awake, alert and oriented in all 4 spheres. Her immediate and remote memory, attention, language skills and fund of knowledge are appropriate. There is no evidence of aphasia, agnosia, apraxia or anomia. Speech is clear with normal prosody and enunciation. Thought process is linear. Mood is normal and affect is normal.  Cranial nerves II - XII are as described above under HEENT exam. In addition: shoulder shrug is normal with equal shoulder height noted. Motor exam: Normal bulk, strength and tone is noted. There is no drift, tremor or rebound. Romberg is negative. Reflexes are 1+ throughout. Fine motor skills and  coordination: intact with normal finger taps, normal hand movements, normal rapid alternating patting, normal foot taps and normal foot agility.  Cerebellar testing: No dysmetria or intention tremor on finger to nose testing. Heel to shin is unremarkable bilaterally. There is no truncal or gait ataxia.  Sensory exam: intact to light touch in the upper and lower extremities.  Gait, station and balance: She stands easily. No veering to one side is noted. No leaning to one side is noted. Posture is age-appropriate and stance is narrow based. Gait shows normal stride length and normal pace. No problems turning are noted.                Assessment and Plan:   In summary, Martyn MalayMichelle Freestone is a very pleasant 42 y.o.-year old female with an underlying medical history of migraine headaches, papilledema, hypertension, depression, anxiety, vitamin D deficiency, and morbid obesity with a BMI of over 45, whose history and physical exam are concerning for obstructive sleep apnea (OSA). I had a long chat with the patient about my findings and the diagnosis of OSA, its prognosis and treatment options. We talked about medical treatments, surgical interventions and non-pharmacological approaches. I explained in particular the risks and ramifications of untreated moderate to severe OSA, especially with respect to developing cardiovascular disease down the Road, including congestive heart failure, difficult to treat hypertension, cardiac arrhythmias, or stroke. Even type 2 diabetes has, in part, been linked to untreated OSA. Symptoms of untreated OSA include daytime sleepiness, memory problems, mood irritability and mood disorder such as depression and anxiety, lack of energy, as well as recurrent headaches, especially morning headaches. We talked about trying to maintain a healthy lifestyle in general, as well as the importance of weight control. I encouraged the patient to eat healthy, exercise daily and keep well hydrated, to  keep a scheduled bedtime and wake time routine, to not skip any meals and eat healthy snacks in between meals. I advised the patient not to drive when feeling sleepy. I recommended the following at this time: sleep study with potential positive airway pressure titration. (We will score hypopneas at 3%).   I explained the sleep test procedure to the patient and also outlined possible surgical and non-surgical treatment options of OSA, including the use of a custom-made dental device (which would require a referral to a specialist dentist or oral surgeon), upper airway surgical options, such as pillar implants, radiofrequency surgery, tongue base surgery, and UPPP (which would involve a  referral to an ENT surgeon). Rarely, jaw surgery such as mandibular advancement may be considered.  I also explained the CPAP treatment option to the patient, who indicated that she would be willing to try CPAP if the need arises. I explained the importance of being compliant with PAP treatment, not only for insurance purposes but primarily to improve Her symptoms, and for the patient's long term health benefit, including to reduce her cardiovascular risks. I answered all her questions today and the patient was in agreement. I will likely see her back after the sleep study is completed and encouraged her to call with any interim questions, concerns, problems or updates.   Thank you very much for allowing me to participate in the care of this nice patient. If I can be of any further assistance to you please do not hesitate to talk to me.  Sincerely,   Huston Foley, MD, PhD

## 2017-03-16 NOTE — Patient Instructions (Addendum)

## 2017-03-23 DIAGNOSIS — R21 Rash and other nonspecific skin eruption: Secondary | ICD-10-CM | POA: Diagnosis not present

## 2017-03-31 ENCOUNTER — Other Ambulatory Visit: Payer: Self-pay | Admitting: Neurology

## 2017-04-01 ENCOUNTER — Other Ambulatory Visit (HOSPITAL_COMMUNITY): Payer: Self-pay | Admitting: Psychiatry

## 2017-04-01 DIAGNOSIS — F411 Generalized anxiety disorder: Secondary | ICD-10-CM

## 2017-04-01 DIAGNOSIS — F331 Major depressive disorder, recurrent, moderate: Secondary | ICD-10-CM

## 2017-04-30 ENCOUNTER — Ambulatory Visit (INDEPENDENT_AMBULATORY_CARE_PROVIDER_SITE_OTHER): Payer: BLUE CROSS/BLUE SHIELD | Admitting: Neurology

## 2017-04-30 DIAGNOSIS — R51 Headache: Secondary | ICD-10-CM

## 2017-04-30 DIAGNOSIS — R519 Headache, unspecified: Secondary | ICD-10-CM

## 2017-04-30 DIAGNOSIS — G4719 Other hypersomnia: Secondary | ICD-10-CM

## 2017-04-30 DIAGNOSIS — G4710 Hypersomnia, unspecified: Secondary | ICD-10-CM | POA: Diagnosis not present

## 2017-04-30 DIAGNOSIS — R351 Nocturia: Secondary | ICD-10-CM

## 2017-04-30 DIAGNOSIS — Z6841 Body Mass Index (BMI) 40.0 and over, adult: Secondary | ICD-10-CM

## 2017-04-30 DIAGNOSIS — R0683 Snoring: Secondary | ICD-10-CM

## 2017-04-30 DIAGNOSIS — G4720 Circadian rhythm sleep disorder, unspecified type: Secondary | ICD-10-CM

## 2017-05-07 NOTE — Procedures (Signed)
PATIENT'S NAME:  Jessica Fritz, Jessica Fritz DOB:      1975/11/15      MR#:    [id number]     DATE OF RECORDING: 04/30/2017 REFERRING M.D.: Dr. Levert Feinstein,         PCP: Mila Palmer, MD Study Performed:   Baseline Polysomnogram HISTORY: 42 year old woman with a history of migraine headaches, papilledema, hypertension, depression, anxiety, vitamin D deficiency, and morbid obesity with a BMI of over 45, who reports snoring and excessive daytime somnolence. The patient endorsed the Epworth Sleepiness Scale at 19 points. The patient's weight 302 pounds with a height of 66 (inches), resulting in a BMI of 48.5 kg/m2. The patient's neck circumference measured 17.5 inches.  CURRENT MEDICATIONS: Proventil, Buspar, Celexa, Vistaril, Zofran, Inderal, Maxalt, Topamax, Dyazide   PROCEDURE:  This is a multichannel digital polysomnogram utilizing the Somnostar 11.2 system.  Electrodes and sensors were applied and monitored per AASM Specifications.   EEG, EOG, Chin and Limb EMG, were sampled at 200 Hz.  ECG, Snore and Nasal Pressure, Thermal Airflow, Respiratory Effort, CPAP Flow and Pressure, Oximetry was sampled at 50 Hz. Digital video and audio were recorded.      BASELINE STUDY  Lights Out was at 22:53 and Lights On at 05:01.  Total recording time (TRT) was 368.5 minutes, with a total sleep time (TST) of  318 minutes.   The patient's sleep latency was 13 minutes.  REM latency was 262.5 minutes, which is markedly delayed. The sleep efficiency was 86.3 %.     SLEEP ARCHITECTURE: WASO (Wake after sleep onset) was 37.5 minutes with mild to moderate sleep fragmentation noted. There were 37.5 minutes in Stage N1, 170 minutes Stage N2, 81.5 minutes Stage N3 and 29 minutes in Stage REM.  The percentage of Stage N1 was 11.8%, which is increased, Stage N2 was 53.5%, which is normal, Stage N3 was 25.6%, which is mildly increase, and Stage R (REM sleep) was 9.1%, which reduced. The arousals were noted as: 27 were spontaneous, 0 were  associated with PLMs, 0 were associated with respiratory events.   Audio and video analysis did not show any abnormal or unusual movements, behaviors, phonations or vocalizations. The patient took no bathroom breaks. Moderate to loud snoring was noted. The EKG was in keeping with normal sinus rhythm (NSR).  RESPIRATORY ANALYSIS:  There were a total of 4 respiratory events:  0 obstructive apneas, 0 central apneas and 0 mixed apneas with a total of 0 apneas and an apnea index (AI) of 0 /hour. There were 4 hypopneas with a hypopnea index of .8 /hour. The patient also had 0 respiratory event related arousals (RERAs).      The total APNEA/HYPOPNEA INDEX (AHI) was .8/hour and the total RESPIRATORY DISTURBANCE INDEX was .8 /hour.  4 events occurred in REM sleep and 0 events in NREM. The REM AHI was 8.3 /hour, versus a non-REM AHI of 0. The patient spent 310 minutes of total sleep time in the supine position and 8 minutes in non-supine.. The supine AHI was 0.8 versus a non-supine AHI of 0.0.  OXYGEN SATURATION & C02:  The Wake baseline 02 saturation was 96%, with the lowest being 92%. Time spent below 89% saturation equaled 0 minutes.  PERIODIC LIMB MOVEMENTS: The patient had a total of 0 Periodic Limb Movements.  The Periodic Limb Movement (PLM) index was 0 and the PLM Arousal index was 0/hour.  Post-study, the patient indicated that sleep was the same as usual.   IMPRESSION:  1.  Primary Snoring 2. Dysfunctions associated with sleep stages or arousal from sleep  RECOMMENDATIONS:  1. This study does not demonstrate any significant obstructive or central sleep disordered breathing with the exception of moderate to loud snoring. For disturbing snoring, an oral appliance (through a qualified dentist) can be considered. This study does not support an intrinsic sleep disorder as a cause of the patient's symptoms. Other causes, including circadian rhythm disturbances, an underlying mood disorder, medication  effect and/or an underlying medical problem cannot be ruled out. 2. This study shows sleep fragmentation and abnormal sleep stage percentages; these are nonspecific findings and per se do not signify an intrinsic sleep disorder or a cause for the patient's sleep-related symptoms. Causes include (but are not limited to) the first night effect of the sleep study, circadian rhythm disturbances, medication effect or an underlying mood disorder or medical problem.  3. The patient should be cautioned not to drive, work at heights, or operate dangerous or heavy equipment when tired or sleepy. Review and reiteration of good sleep hygiene measures should be pursued with any patient. 4. The patient can follow-up with his referring provider, who will be notified of the test results.  I certify that I have reviewed the entire raw data recording prior to the issuance of this report in accordance with the Standards of Accreditation of the American Academy of Sleep Medicine (AASM)   Huston FoleySaima Maziyah Vessel, MD, PhD Diplomat, American Board of Psychiatry and Neurology (Neurology and Sleep)

## 2017-05-07 NOTE — Progress Notes (Signed)
Patient referred by Dr. Terrace ArabiaYan, seen by me on 03/16/17, diagnostic PSG on 04/30/17.   Please call and notify the patient that the recent sleep study did not show any significant obstructive sleep apnea. She did have consistent snoring. For disturbing snoring, an oral appliance (through a qualified dentist) can be considered.  Please inform patient that she can follow up with Dr. Terrace ArabiaYan. Please remind patient to try to maintain good sleep hygiene, which means: Keep a regular sleep and wake schedule and make enough time for sleep (7 1/2 to 8 1/2 hours for the average adult), try not to exercise or have a meal within 2 hours of your bedtime, try to keep your bedroom conducive for sleep, that is, cool and dark, without light distractors such as an illuminated alarm clock, and refrain from watching TV right before sleep or in the middle of the night and do not keep the TV or radio on during the night. If a nightlight is used, have it away from the visual field. Also, try not to use or play on electronic devices at bedtime, such as your cell phone, tablet PC or laptop. If you like to read at bedtime on an electronic device, try to dim the background light as much as possible. Do not eat in the middle of the night. Keep pets away from the bedroom environment. For stress relief, try meditation, deep breathing exercises (there are many books and CDs available), a white noise machine or fan can help to diffuse other noise distractors, such as traffic noise. Do not drink alcohol before bedtime, as it can disturb sleep and cause middle of the night awakenings. Never mix alcohol and sedating medications! Avoid narcotic pain medication close to bedtime, as opioids/narcotics can suppress breathing drive and breathing effort.   Thanks,  Huston FoleySaima Farida Mcreynolds, MD, PhD [facility])

## 2017-05-10 ENCOUNTER — Telehealth: Payer: Self-pay

## 2017-05-10 NOTE — Telephone Encounter (Signed)
-----   Message from Huston FoleySaima Athar, MD sent at 05/07/2017 12:29 PM EDT ----- Patient referred by Dr. Terrace ArabiaYan, seen by me on 03/16/17, diagnostic PSG on 04/30/17.   Please call and notify the patient that the recent sleep study did not show any significant obstructive sleep apnea. She did have consistent snoring. For disturbing snoring, an oral appliance (through a qualified dentist) can be considered.  Please inform patient that she can follow up with Dr. Terrace ArabiaYan. Please remind patient to try to maintain good sleep hygiene, which means: Keep a regular sleep and wake schedule and make enough time for sleep (7 1/2 to 8 1/2 hours for the average adult), try not to exercise or have a meal within 2 hours of your bedtime, try to keep your bedroom conducive for sleep, that is, cool and dark, without light distractors such as an illuminated alarm clock, and refrain from watching TV right before sleep or in the middle of the night and do not keep the TV or radio on during the night. If a nightlight is used, have it away from the visual field. Also, try not to use or play on electronic devices at bedtime, such as your cell phone, tablet PC or laptop. If you like to read at bedtime on an electronic device, try to dim the background light as much as possible. Do not eat in the middle of the night. Keep pets away from the bedroom environment. For stress relief, try meditation, deep breathing exercises (there are many books and CDs available), a white noise machine or fan can help to diffuse other noise distractors, such as traffic noise. Do not drink alcohol before bedtime, as it can disturb sleep and cause middle of the night awakenings. Never mix alcohol and sedating medications! Avoid narcotic pain medication close to bedtime, as opioids/narcotics can suppress breathing drive and breathing effort.   Thanks,  Huston FoleySaima Athar, MD, PhD [facility])

## 2017-05-10 NOTE — Telephone Encounter (Signed)
I called pt to discuss. No answer, left a message asking her to call me back. 

## 2017-05-11 NOTE — Telephone Encounter (Signed)
I called pt. I advised pt that Dr. Frances FurbishAthar reviewed pt's sleep study and found that pt did not show any significant sleep apnea. Pt did have consistent snoring, and I discussed the possibility of an oral appliance for her snoring, if desired. Dr. Frances FurbishAthar recommends that pt follow up with Dr. Terrace ArabiaYan. I reviewed sleep hygiene recommendations with the pt, including trying to keep a regular sleep wake schedule, avoiding electronics in the bedroom, keeping the bedroom cool, dark, and quiet, and avoiding eating or exercising within 2 hours of bedtime as well as eating in the middle of the night. I advised pt to keep pets out of the bedroom. I discussed with pt the importance of stress relief and to try meditation, deep breathing exercises, and/or a white noise machine or fan to diffuse other noise distracters. I advised pt to not drink alcohol before bedtime and to never mix alcohol and sedating medications. Pt was advised to avoid narcotic pain medication close to bedtime. I advised pt that a copy of these sleep study results will be sent to Dr. Terrace ArabiaYan. Pt verbalized understanding of results. Pt had no questions at this time but was encouraged to call back if questions arise.

## 2017-05-14 DIAGNOSIS — R0602 Shortness of breath: Secondary | ICD-10-CM | POA: Diagnosis not present

## 2017-05-14 DIAGNOSIS — N915 Oligomenorrhea, unspecified: Secondary | ICD-10-CM | POA: Diagnosis not present

## 2017-05-14 DIAGNOSIS — R5383 Other fatigue: Secondary | ICD-10-CM | POA: Diagnosis not present

## 2017-05-21 DIAGNOSIS — R7303 Prediabetes: Secondary | ICD-10-CM | POA: Diagnosis not present

## 2017-05-21 DIAGNOSIS — G43909 Migraine, unspecified, not intractable, without status migrainosus: Secondary | ICD-10-CM | POA: Diagnosis not present

## 2017-05-21 DIAGNOSIS — R0602 Shortness of breath: Secondary | ICD-10-CM | POA: Diagnosis not present

## 2017-06-20 ENCOUNTER — Other Ambulatory Visit: Payer: Self-pay | Admitting: Neurology

## 2017-06-22 NOTE — Progress Notes (Signed)
[facility]  PATIENT: Jessica Fritz DOB: 20-Oct-1975   REASON FOR VISIT: Follow-up for acute migraine since Monday HISTORY FROM: Patient    HISTORY OF PRESENT ILLNESS: Jessica Fritz is a 42 years old right-handed female  seen in refer by her optometrist Dr.  Lorin Picket, Jon,for evaluation of right side papillary edema, initial evaluation was October 15 2016.   She had past medical history of obesity, wearing glasses for many years, has seen Dr. Lorin Picket for many years, during her most recent yearly checkup in August 2018, she was noted to have mild right papillary edema.  She reported long history of migraine headaches since high school, her typical migraine are severe lateralized pounding headache with associated light noise sensitivity, nauseous, lasting for a few hours, usually clustered around her menstruation period of time, since June 2018 she noticed increased headache, she also described transient blurry vision with sudden positional change such as bending over, most of her headache at right side now. She also complains of increased blurry vision  She has chronic obesity, there was no significant weight change.  Update December 07 2016:YYShe tolerates topamax  2 tabs twice a day, still wake up daily with headaches, holocranial pressure headache, light sensitive, the worst headaches are often in the morning time, dizziness spells,  She had lumbar puncture November 20 2016, open pressure was 23 cm water, closing pressure was 14 cm H2O. spinal fluid testing showed total protein of 49, glucose of 66. We have personally reviewed MRI of the brain, mild supratentorium small vessel disease, MRI of cervical spine, multilevel degenerative disc disease, most severe at C4-5, C5-6, variable degree of foraminal narrowing, but there is no evidence of nerve roots or cervical cord compression. She complains of excessive sleepiness, snoring, early morning  headaches,  UPDATE 1/15/2019CM  Jessica Fritz, 42 year old female returns for follow-up with history of headaches, morbid obesity and probable sleep apnea.  Sleep study was ordered at her last visit however this is not been done.  Patient never got a call.  She has a headache today which she rates on a pain scale of 1-10 as a 6 to 7.  She would like to get some medication for this if  possible so that she can go to work.  She claims she has missed a lot of work.  She is nauseated but has not vomited.  She is currently on atenolol 60 mg LA daily and Topamax 100 twice daily.  She has Maxalt to take acutely.  LP repeated the last of October.  Opening pressure 23.  No significant abnormality in  CSF fluid.  She returns for reevaluation  UPDATE 5/1/2019[name], 42 year old female returns for follow-up with history headaches morbid obesity.  Sleep apnea has been ruled out.  She has had a migraine since Monday which has waxed and waned.  She has some light sensitivity, nauseated no vomiting she sometimes has dizzy spells.  It is a throbbing pain, she also has sensitivity to sounds.  She was encouraged to stay well-hydrated  She has not taken her Maxalt today and states it makes her too sleepy.  She stopped her propanolol when she was placed on atenolol by her primary care half of 25 mg tablet.  She was made aware that the propanolol is a good migraine preventative along with the Topamax.  She was also given a list of foods that are problematic in terms of triggers.  She has never tried prednisone Dosepak before.  She returns for  reevaluation REVIEW OF SYSTEMS: Full 14 system review of systems performed and notable only for those listed, all others are neg:  Constitutional: neg  Cardiovascular: neg Ear/Nose/Throat: neg  Skin: neg Eyes: Light sensitivity, blurred vision Respiratory: neg Gastroitestinal: neg  Hematology/Lymphatic: neg  Endocrine: neg Musculoskeletal:neg Allergy/Immunology: neg Neurological:  Headache dizziness Psychiatric: Depression anxiety Sleep :  d  ALLERGIES: No Known Allergies  HOME MEDICATIONS: Outpatient Medications Prior to Visit  Medication Sig Dispense Refill  . albuterol (PROVENTIL) (2.5 MG/3ML) 0.083% nebulizer solution Take 2.5 mg by nebulization every 6 (six) hours as needed for wheezing or shortness of breath.    . Ascorbic Acid (VITAMIN C) 100 MG tablet Take 100 mg by mouth daily.    Marland Kitchen aspirin-acetaminophen-caffeine (EXCEDRIN MIGRAINE) 250-250-65 MG tablet Take 2 tablets by mouth every 6 (six) hours as needed for headache.    Marland Kitchen atenolol (TENORMIN) 25 MG tablet TK 1 T PO QD  (taking 1/2 tab daily)  4  . busPIRone (BUSPAR) 15 MG tablet Take 1 tablet (15 mg total) by mouth 2 (two) times daily. 180 tablet 0  . calcium carbonate (TUMS - DOSED IN MG ELEMENTAL CALCIUM) 500 MG chewable tablet Chew 2 tablets by mouth daily.      . citalopram (CELEXA) 40 MG tablet Take 1 tablet (40 mg total) by mouth daily. 90 tablet 0  . hydrOXYzine (ATARAX/VISTARIL) 50 MG tablet Take 1 tablet (50 mg total) by mouth at bedtime. 90 tablet 0  . Multiple Vitamin (MULTIVITAMIN) tablet Take 1 tablet by mouth daily.      . ondansetron (ZOFRAN) 4 MG tablet TAKE 1 TABLET(4 MG) BY MOUTH EVERY 8 HOURS AS NEEDED FOR NAUSEA OR VOMITING 30 tablet 0  . rizatriptan (MAXALT) 5 MG tablet TAKE 1 TABLET BY MOUTH AS NEEDED FOR MIGRAINE AND MAY REPEAT IN 2 HOURS IF NEEDED( MAX 2 TABLETS PER 24 HOURS OR 10 TABLETS PER 30 DAYS) 10 tablet 11  . topiramate (TOPAMAX) 100 MG tablet Take 1 tablet (100 mg total) by mouth 2 (two) times daily. 60 tablet 11  . triamterene-hydrochlorothiazide (DYAZIDE) 37.5-25 MG capsule Take 1 capsule by mouth daily.    . vitamin B-12 (CYANOCOBALAMIN) 100 MCG tablet Take 100 mcg by mouth daily.    . Vitamin D, Ergocalciferol, (DRISDOL) 50000 UNITS CAPS capsule Take 50,000 Units by mouth every 7 (seven) days.    . propranolol ER (INDERAL LA) 60 MG 24 hr capsule Take 1 capsule (60 mg  total) by mouth daily. (Patient not taking: Reported on 06/23/2017) 30 capsule 11   No facility-administered medications prior to visit.     PAST MEDICAL HISTORY: Past Medical History:  Diagnosis Date  . Anxiety   . Depression   . Headache(784.0)   . Hypertension   . Migraine   . Papilledema, right eye   . Vitamin D deficiency     PAST SURGICAL HISTORY: Past Surgical History:  Procedure Laterality Date  . EYE SURGERY      FAMILY HISTORY: Family History  Problem Relation Age of Onset  . Alcohol abuse Paternal Grandfather   . Healthy Mother   . Diabetes Father   . Anxiety disorder Neg Hx   . Bipolar disorder Neg Hx   . Depression Neg Hx   . Suicidality Neg Hx     SOCIAL HISTORY: Social History   Socioeconomic History  . Marital status: Single    Spouse name: Not on file  . Number of children: 0  . Years of education: some  college  . Highest education level: Not on file  Occupational History  . Not on file  Social Needs  . Financial resource strain: Not on file  . Food insecurity:    Worry: Not on file    Inability: Not on file  . Transportation needs:    Medical: Not on file    Non-medical: Not on file  Tobacco Use  . Smoking status: Never Smoker  . Smokeless tobacco: Never Used  Substance and Sexual Activity  . Alcohol use: Yes    Comment: occ  . Drug use: No  . Sexual activity: Never    Birth control/protection: None  Lifestyle  . Physical activity:    Days per week: Not on file    Minutes per session: Not on file  . Stress: Not on file  Relationships  . Social connections:    Talks on phone: Not on file    Gets together: Not on file    Attends religious service: Not on file    Active member of club or organization: Not on file    Attends meetings of clubs or organizations: Not on file    Relationship status: Not on file  . Intimate partner violence:    Fear of current or ex partner: Not on file    Emotionally abused: Not on file     Physically abused: Not on file    Forced sexual activity: Not on file  Other Topics Concern  . Not on file  Social History Narrative   Lives at home alone.   Right-handed.   3-4 cups caffeine per day.     PHYSICAL EXAM  Vitals:   06/23/17 1442  BP: 135/68  Pulse: (!) 57  Weight: 295 lb 3.2 oz (133.9 kg)  Height:  (1.676 m)   Body mass index is 47.65 kg/m.  Generalized: Well developed, morbidly obese female in no acute distress  Head: normocephalic and atraumatic,. Oropharynx benign  Neck: Supple,  Musculoskeletal: No deformity   Neurological examination   Mentation: Alert oriented to time, place, history taking. Attention span and concentration appropriate. Recent and remote memory intact.  Follows all commands speech and language fluent.   Cranial nerve II-XII: Fundoscopic exam not done due to migraine Pupils were equal round reactive to light extraocular movements were full, visual field were full on confrontational test. Facial sensation and strength were normal. hearing was intact to finger rubbing bilaterally. Uvula tongue midline. head turning and shoulder shrug were normal and symmetric.Tongue protrusion into cheek strength was normal. Motor: normal bulk and tone, full strength in the BUE, BLE, Sensory: normal and symmetric to light touch,  Coordination: finger-nose-finger, heel-to-shin bilaterally, no dysmetria Reflexes: Symmetric upper and lower plantar responses were flexor bilaterally. Gait and Station: Rising up from seated position without assistance, normal stance,  moderate stride, good arm swing, smooth turning, able to perform tiptoe, and heel walking without difficulty. Tandem gait is steady  DIAGNOSTIC DATA (LABS, IMAGING, TESTING) - I reviewed patient records, labs, notes, testing and imaging myself where available.  Lab Results  Component Value Date   WBC 7.1 06/24/2016   HGB 11.1 (L) 06/24/2016   HCT 31.7 (L) 06/24/2016   MCV 86.4 06/24/2016    PLT 295 06/24/2016      Component Value Date/Time   NA 136 06/24/2016 1836   K 3.6 06/24/2016 1836   CL 103 06/24/2016 1836   CO2 27 06/24/2016 1836   GLUCOSE 98 06/24/2016 1836   BUN 9 06/24/2016  1836   CREATININE 0.74 06/24/2016 1836   CALCIUM 8.6 (L) 06/24/2016 1836   PROT 7.4 09/09/2007 0949   ALBUMIN 4.3 09/09/2007 0949   AST 27 09/09/2007 0949   ALT 27 09/09/2007 0949   ALKPHOS 45 09/09/2007 0949   BILITOT 0.7 09/09/2007 0949   GFRNONAA >60 06/24/2016 1836   GFRAA >60 06/24/2016 1836    ASSESSMENT AND PLAN Renley Gutman is a 42 y.o. female   with migraine headaches which are chronic.  She has a migraine today is a 6-7 on a pain scale of 1-10.  Possible pseudotumor cerebri however most recent LP opening pressure 23 and CSF within normal limits.  Obstructive sleep apnea  Ruled out.                   PLAN:  Prednisone Dosepak to break current headache since Monday Continue Topamax 100 mg twice daily Try Relpax 40 mg acutely as needed Restart  propanolol 60 mg daily  sleep study did not show any significant sleep apnea but consistent snoring Follow-up in 4 months  I spent 25 minutes in total face to face time with the patient more than 50% of which was spent counseling and coordination of care, reviewing test results reviewing medications and discussing and reviewing the diagnosis of migraine, migraine triggers and further treatment options. , Cline Crock, [facility], APRN  [facility] [address], [address] [place], [place] [address] [phone]

## 2017-06-23 ENCOUNTER — Ambulatory Visit: Payer: BLUE CROSS/BLUE SHIELD | Admitting: Nurse Practitioner

## 2017-06-23 ENCOUNTER — Encounter: Payer: Self-pay | Admitting: Nurse Practitioner

## 2017-06-23 VITALS — BP 135/68 | HR 57 | Ht 66.0 in | Wt 295.2 lb

## 2017-06-23 DIAGNOSIS — G43709 Chronic migraine without aura, not intractable, without status migrainosus: Secondary | ICD-10-CM | POA: Diagnosis not present

## 2017-06-23 DIAGNOSIS — IMO0002 Reserved for concepts with insufficient information to code with codable children: Secondary | ICD-10-CM

## 2017-06-23 MED ORDER — PREDNISONE 10 MG PO TABS: ORAL_TABLET | ORAL | 0 refills | Status: DC

## 2017-06-23 MED ORDER — ELETRIPTAN HYDROBROMIDE 40 MG PO TABS
40.0000 mg | ORAL_TABLET | ORAL | 4 refills | Status: DC | PRN
Start: 2017-06-23 — End: 2017-10-26

## 2017-06-23 NOTE — Patient Instructions (Signed)
Prednisone Dosepak to break current headache Continue Topamax 100 mg twice daily Relpax 40 mg acutely as needed Restart  propanolol 60 mg daily  sleep study did not show any significant sleep apnea but consistent snoring Follow-up in 4 months

## 2017-06-25 ENCOUNTER — Encounter: Payer: BLUE CROSS/BLUE SHIELD | Attending: Family Medicine

## 2017-06-25 DIAGNOSIS — Z713 Dietary counseling and surveillance: Secondary | ICD-10-CM | POA: Diagnosis not present

## 2017-06-25 DIAGNOSIS — R7303 Prediabetes: Secondary | ICD-10-CM | POA: Diagnosis not present

## 2017-06-25 NOTE — Patient Instructions (Addendum)
-   Encourage small amounts of physical activity (try to increase step count throughout day) - Try to get three meals per day  - Attempt to eat in more often and try new recipes  - Reduce distractions and slow down at meal times - Pay attention to your body's hunger and fullness cues, honor them - At meal times create balanced plates.  - Incorporate fruits and vegetables into your daily meals/snacks  - Reduce sugar sweetened beverage consumption

## 2017-06-25 NOTE — Progress Notes (Signed)
Received fax confirmation for relpax [phone] [facility].

## 2017-06-25 NOTE — Progress Notes (Signed)
Medical Nutrition Therapy:  Appt start time: 1110 end time:  1194.  Assessment:  Primary concerns today: pt referred for prediabetes. Pt works at call center for bank collections, 1pm-10pm, with 1 meal time and 2 breaks. Pt inquired about different current fad diets, RD discussed how we do not promote these.   Preferred Learning Style:   No preference indicated   Learning Readiness:  Not ready  MEDICATIONS: Reviewed   DIETARY INTAKE:  Usual eating pattern varies 1-2 meals and 1-2 snacks per day.  Everyday foods include none reported.  Avoided foods include none reported.     24-hr recall:  B ( AM): None  Snk ( AM): None  L ( PM): big mac with medium fry with ginger ale Snk ( PM): chicken nuggets (6)  D ( PM): None  Snk ( PM): movie style butter popcorn  Beverages: typically coke (1-2 20 oz bottle), water, Starbucks (caramel macchiato), hot tea, sweet tea  Usual physical activity: occasional walk dogs 1-2 times/week (averages 3,000 steps daily)   Nutritional Diagnosis:  NB-1.1 Food and nutrition-related knowledge deficit As related to limited prior education.  As evidenced by pt report and consult.    Intervention:  Nutrition education and counseling provided.  Discussed tip to help with mindful eating. Encouraged reducing sugar sweetened beverage consumption. Pt hopes to make changes and follow-up soon.  Goals: - Encourage small amounts of physical activity (try to increase step count throughout day) - Try to get three meals per day  - Attempt to eat in more often and try new recipes  - Reduce distractions and slow down at meal times - Pay attention to your body's hunger and fullness cues, honor them - At meal times create balanced plates.  - Incorporate fruits and vegetables into your daily meals/snacks  - Reduce sugar sweetened beverage consumption   Teaching Method Utilized: Visual Auditory  Handouts given during visit include:  Diabetic Portion Plate  Barriers  to learning/adherence to lifestyle change: N/A  Demonstrated degree of understanding via:  Teach Back   Monitoring/Evaluation:  Dietary intake, exercise, A1c, and body weight prn.

## 2017-06-25 NOTE — Progress Notes (Signed)
I have reviewed and agreed above plan. 

## 2017-07-07 ENCOUNTER — Ambulatory Visit: Payer: BLUE CROSS/BLUE SHIELD | Admitting: Nurse Practitioner

## 2017-07-15 ENCOUNTER — Telehealth: Payer: Self-pay | Admitting: Neurology

## 2017-07-15 NOTE — Telephone Encounter (Signed)
I reviewed examination from her optometrist Dr. Ronney Asters on Jul 13 2017. improved appearance, was diagnosed with pseudopapillary edema of right optic disc

## 2017-08-05 ENCOUNTER — Other Ambulatory Visit: Payer: Self-pay | Admitting: Neurology

## 2017-08-20 DIAGNOSIS — R7303 Prediabetes: Secondary | ICD-10-CM | POA: Diagnosis not present

## 2017-09-10 ENCOUNTER — Emergency Department (HOSPITAL_COMMUNITY)
Admission: EM | Admit: 2017-09-10 | Discharge: 2017-09-10 | Disposition: A | Discharge status: Home / Self Care | Payer: BLUE CROSS/BLUE SHIELD | Attending: Emergency Medicine | Admitting: Emergency Medicine

## 2017-09-10 ENCOUNTER — Emergency Department (HOSPITAL_COMMUNITY): Payer: BLUE CROSS/BLUE SHIELD

## 2017-09-10 ENCOUNTER — Other Ambulatory Visit: Payer: Self-pay

## 2017-09-10 DIAGNOSIS — S4992XA Unspecified injury of left shoulder and upper arm, initial encounter: Secondary | ICD-10-CM | POA: Diagnosis not present

## 2017-09-10 DIAGNOSIS — I10 Essential (primary) hypertension: Secondary | ICD-10-CM | POA: Diagnosis not present

## 2017-09-10 DIAGNOSIS — Y939 Activity, unspecified: Secondary | ICD-10-CM | POA: Diagnosis not present

## 2017-09-10 DIAGNOSIS — S46912A Strain of unspecified muscle, fascia and tendon at shoulder and upper arm level, left arm, initial encounter: Secondary | ICD-10-CM | POA: Diagnosis not present

## 2017-09-10 DIAGNOSIS — Y999 Unspecified external cause status: Secondary | ICD-10-CM | POA: Diagnosis not present

## 2017-09-10 DIAGNOSIS — Z79899 Other long term (current) drug therapy: Secondary | ICD-10-CM | POA: Insufficient documentation

## 2017-09-10 DIAGNOSIS — M25512 Pain in left shoulder: Secondary | ICD-10-CM | POA: Diagnosis not present

## 2017-09-10 DIAGNOSIS — M62830 Muscle spasm of back: Secondary | ICD-10-CM | POA: Insufficient documentation

## 2017-09-10 DIAGNOSIS — Y929 Unspecified place or not applicable: Secondary | ICD-10-CM | POA: Insufficient documentation

## 2017-09-10 MED ORDER — CYCLOBENZAPRINE HCL 10 MG PO TABS: 10.0000 mg | ORAL_TABLET | Freq: Two times a day (BID) | ORAL | 0 refills | Status: AC | PRN

## 2017-09-10 MED ORDER — KETOROLAC TROMETHAMINE 60 MG/2ML IM SOLN
30.0000 mg | Freq: Once | INTRAMUSCULAR | Status: AC
  Administered 2017-09-10: 30 mg via INTRAMUSCULAR
  Filled 2017-09-10: qty 2

## 2017-09-10 MED ORDER — CYCLOBENZAPRINE HCL 10 MG PO TABS
10.0000 mg | ORAL_TABLET | Freq: Once | ORAL | Status: AC
  Administered 2017-09-10: 10 mg via ORAL
  Filled 2017-09-10: qty 1

## 2017-09-10 NOTE — ED Notes (Signed)
Patient transported to X-ray 

## 2017-09-10 NOTE — Discharge Instructions (Signed)
Follow up with your doctor in the next few days for recheck. Return here for worsening symptoms.  °
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
## 2017-09-13 DIAGNOSIS — M62838 Other muscle spasm: Secondary | ICD-10-CM | POA: Diagnosis not present

## 2017-09-13 DIAGNOSIS — M25512 Pain in left shoulder: Secondary | ICD-10-CM | POA: Diagnosis not present

## 2017-09-15 DIAGNOSIS — S13130A Subluxation of C2/C3 cervical vertebrae, initial encounter: Secondary | ICD-10-CM | POA: Diagnosis not present

## 2017-09-15 DIAGNOSIS — S13170A Subluxation of C6/C7 cervical vertebrae, initial encounter: Secondary | ICD-10-CM | POA: Diagnosis not present

## 2017-09-15 DIAGNOSIS — S13160A Subluxation of C5/C6 cervical vertebrae, initial encounter: Secondary | ICD-10-CM | POA: Diagnosis not present

## 2017-09-15 DIAGNOSIS — S13120A Subluxation of C1/C2 cervical vertebrae, initial encounter: Secondary | ICD-10-CM | POA: Diagnosis not present

## 2017-09-16 DIAGNOSIS — S13160D Subluxation of C5/C6 cervical vertebrae, subsequent encounter: Secondary | ICD-10-CM | POA: Diagnosis not present

## 2017-09-16 DIAGNOSIS — S13170D Subluxation of C6/C7 cervical vertebrae, subsequent encounter: Secondary | ICD-10-CM | POA: Diagnosis not present

## 2017-09-16 DIAGNOSIS — S13120D Subluxation of C1/C2 cervical vertebrae, subsequent encounter: Secondary | ICD-10-CM | POA: Diagnosis not present

## 2017-09-16 DIAGNOSIS — S13130D Subluxation of C2/C3 cervical vertebrae, subsequent encounter: Secondary | ICD-10-CM | POA: Diagnosis not present

## 2017-09-20 DIAGNOSIS — S13120D Subluxation of C1/C2 cervical vertebrae, subsequent encounter: Secondary | ICD-10-CM | POA: Diagnosis not present

## 2017-09-20 DIAGNOSIS — S13170D Subluxation of C6/C7 cervical vertebrae, subsequent encounter: Secondary | ICD-10-CM | POA: Diagnosis not present

## 2017-09-20 DIAGNOSIS — S13130D Subluxation of C2/C3 cervical vertebrae, subsequent encounter: Secondary | ICD-10-CM | POA: Diagnosis not present

## 2017-09-20 DIAGNOSIS — S13160D Subluxation of C5/C6 cervical vertebrae, subsequent encounter: Secondary | ICD-10-CM | POA: Diagnosis not present

## 2017-09-21 DIAGNOSIS — S13130D Subluxation of C2/C3 cervical vertebrae, subsequent encounter: Secondary | ICD-10-CM | POA: Diagnosis not present

## 2017-09-21 DIAGNOSIS — S13170D Subluxation of C6/C7 cervical vertebrae, subsequent encounter: Secondary | ICD-10-CM | POA: Diagnosis not present

## 2017-09-21 DIAGNOSIS — S13120D Subluxation of C1/C2 cervical vertebrae, subsequent encounter: Secondary | ICD-10-CM | POA: Diagnosis not present

## 2017-09-21 DIAGNOSIS — S13160D Subluxation of C5/C6 cervical vertebrae, subsequent encounter: Secondary | ICD-10-CM | POA: Diagnosis not present

## 2017-09-23 ENCOUNTER — Encounter (HOSPITAL_COMMUNITY): Payer: Self-pay | Admitting: Psychiatry

## 2017-09-23 ENCOUNTER — Ambulatory Visit (HOSPITAL_COMMUNITY): Payer: BLUE CROSS/BLUE SHIELD | Admitting: Psychiatry

## 2017-09-23 DIAGNOSIS — Z811 Family history of alcohol abuse and dependence: Secondary | ICD-10-CM | POA: Diagnosis not present

## 2017-09-23 DIAGNOSIS — F331 Major depressive disorder, recurrent, moderate: Secondary | ICD-10-CM | POA: Diagnosis not present

## 2017-09-23 DIAGNOSIS — S13160D Subluxation of C5/C6 cervical vertebrae, subsequent encounter: Secondary | ICD-10-CM | POA: Diagnosis not present

## 2017-09-23 DIAGNOSIS — S13130D Subluxation of C2/C3 cervical vertebrae, subsequent encounter: Secondary | ICD-10-CM | POA: Diagnosis not present

## 2017-09-23 DIAGNOSIS — S13120D Subluxation of C1/C2 cervical vertebrae, subsequent encounter: Secondary | ICD-10-CM | POA: Diagnosis not present

## 2017-09-23 DIAGNOSIS — S13170D Subluxation of C6/C7 cervical vertebrae, subsequent encounter: Secondary | ICD-10-CM | POA: Diagnosis not present

## 2017-09-23 DIAGNOSIS — F411 Generalized anxiety disorder: Secondary | ICD-10-CM

## 2017-09-23 MED ORDER — BUSPIRONE HCL 15 MG PO TABS: 15.0000 mg | ORAL_TABLET | Freq: Two times a day (BID) | ORAL | 0 refills | Status: DC

## 2017-09-23 MED ORDER — BUPROPION HCL ER (XL) 150 MG PO TB24: 150.0000 mg | ORAL_TABLET | Freq: Every day | ORAL | 1 refills | Status: DC
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
## 2017-09-28 DIAGNOSIS — S13160D Subluxation of C5/C6 cervical vertebrae, subsequent encounter: Secondary | ICD-10-CM | POA: Diagnosis not present

## 2017-09-28 DIAGNOSIS — S13170D Subluxation of C6/C7 cervical vertebrae, subsequent encounter: Secondary | ICD-10-CM | POA: Diagnosis not present

## 2017-09-28 DIAGNOSIS — F4311 Post-traumatic stress disorder, acute: Secondary | ICD-10-CM | POA: Diagnosis not present

## 2017-09-28 DIAGNOSIS — M542 Cervicalgia: Secondary | ICD-10-CM | POA: Diagnosis not present

## 2017-09-28 DIAGNOSIS — S13120D Subluxation of C1/C2 cervical vertebrae, subsequent encounter: Secondary | ICD-10-CM | POA: Diagnosis not present

## 2017-09-28 DIAGNOSIS — S13130D Subluxation of C2/C3 cervical vertebrae, subsequent encounter: Secondary | ICD-10-CM | POA: Diagnosis not present

## 2017-09-28 DIAGNOSIS — M549 Dorsalgia, unspecified: Secondary | ICD-10-CM | POA: Diagnosis not present

## 2017-09-29 DIAGNOSIS — S13170D Subluxation of C6/C7 cervical vertebrae, subsequent encounter: Secondary | ICD-10-CM | POA: Diagnosis not present

## 2017-09-29 DIAGNOSIS — S13160D Subluxation of C5/C6 cervical vertebrae, subsequent encounter: Secondary | ICD-10-CM | POA: Diagnosis not present

## 2017-09-29 DIAGNOSIS — S13130D Subluxation of C2/C3 cervical vertebrae, subsequent encounter: Secondary | ICD-10-CM | POA: Diagnosis not present

## 2017-09-29 DIAGNOSIS — S13120D Subluxation of C1/C2 cervical vertebrae, subsequent encounter: Secondary | ICD-10-CM | POA: Diagnosis not present

## 2017-09-30 DIAGNOSIS — S13120D Subluxation of C1/C2 cervical vertebrae, subsequent encounter: Secondary | ICD-10-CM | POA: Diagnosis not present

## 2017-09-30 DIAGNOSIS — S13160D Subluxation of C5/C6 cervical vertebrae, subsequent encounter: Secondary | ICD-10-CM | POA: Diagnosis not present

## 2017-09-30 DIAGNOSIS — S13130D Subluxation of C2/C3 cervical vertebrae, subsequent encounter: Secondary | ICD-10-CM | POA: Diagnosis not present

## 2017-09-30 DIAGNOSIS — S13170D Subluxation of C6/C7 cervical vertebrae, subsequent encounter: Secondary | ICD-10-CM | POA: Diagnosis not present

## 2017-10-04 DIAGNOSIS — S13170D Subluxation of C6/C7 cervical vertebrae, subsequent encounter: Secondary | ICD-10-CM | POA: Diagnosis not present

## 2017-10-04 DIAGNOSIS — S13130D Subluxation of C2/C3 cervical vertebrae, subsequent encounter: Secondary | ICD-10-CM | POA: Diagnosis not present

## 2017-10-04 DIAGNOSIS — S13160D Subluxation of C5/C6 cervical vertebrae, subsequent encounter: Secondary | ICD-10-CM | POA: Diagnosis not present

## 2017-10-04 DIAGNOSIS — S13120D Subluxation of C1/C2 cervical vertebrae, subsequent encounter: Secondary | ICD-10-CM | POA: Diagnosis not present

## 2017-10-05 DIAGNOSIS — S13170D Subluxation of C6/C7 cervical vertebrae, subsequent encounter: Secondary | ICD-10-CM | POA: Diagnosis not present

## 2017-10-05 DIAGNOSIS — S13160D Subluxation of C5/C6 cervical vertebrae, subsequent encounter: Secondary | ICD-10-CM | POA: Diagnosis not present

## 2017-10-05 DIAGNOSIS — S13120D Subluxation of C1/C2 cervical vertebrae, subsequent encounter: Secondary | ICD-10-CM | POA: Diagnosis not present

## 2017-10-05 DIAGNOSIS — S13130D Subluxation of C2/C3 cervical vertebrae, subsequent encounter: Secondary | ICD-10-CM | POA: Diagnosis not present

## 2017-10-06 DIAGNOSIS — S13130D Subluxation of C2/C3 cervical vertebrae, subsequent encounter: Secondary | ICD-10-CM | POA: Diagnosis not present

## 2017-10-06 DIAGNOSIS — S13160D Subluxation of C5/C6 cervical vertebrae, subsequent encounter: Secondary | ICD-10-CM | POA: Diagnosis not present

## 2017-10-06 DIAGNOSIS — S13170D Subluxation of C6/C7 cervical vertebrae, subsequent encounter: Secondary | ICD-10-CM | POA: Diagnosis not present

## 2017-10-06 DIAGNOSIS — S13120D Subluxation of C1/C2 cervical vertebrae, subsequent encounter: Secondary | ICD-10-CM | POA: Diagnosis not present

## 2017-10-11 DIAGNOSIS — S13170D Subluxation of C6/C7 cervical vertebrae, subsequent encounter: Secondary | ICD-10-CM | POA: Diagnosis not present

## 2017-10-11 DIAGNOSIS — S13160D Subluxation of C5/C6 cervical vertebrae, subsequent encounter: Secondary | ICD-10-CM | POA: Diagnosis not present

## 2017-10-11 DIAGNOSIS — S13130D Subluxation of C2/C3 cervical vertebrae, subsequent encounter: Secondary | ICD-10-CM | POA: Diagnosis not present

## 2017-10-11 DIAGNOSIS — S13120D Subluxation of C1/C2 cervical vertebrae, subsequent encounter: Secondary | ICD-10-CM | POA: Diagnosis not present

## 2017-10-12 DIAGNOSIS — S13130D Subluxation of C2/C3 cervical vertebrae, subsequent encounter: Secondary | ICD-10-CM | POA: Diagnosis not present

## 2017-10-12 DIAGNOSIS — S13160D Subluxation of C5/C6 cervical vertebrae, subsequent encounter: Secondary | ICD-10-CM | POA: Diagnosis not present

## 2017-10-12 DIAGNOSIS — S13120D Subluxation of C1/C2 cervical vertebrae, subsequent encounter: Secondary | ICD-10-CM | POA: Diagnosis not present

## 2017-10-12 DIAGNOSIS — S13170D Subluxation of C6/C7 cervical vertebrae, subsequent encounter: Secondary | ICD-10-CM | POA: Diagnosis not present

## 2017-10-13 ENCOUNTER — Other Ambulatory Visit: Payer: Self-pay | Admitting: Neurology

## 2017-10-14 ENCOUNTER — Other Ambulatory Visit: Payer: Self-pay | Admitting: *Deleted

## 2017-10-14 DIAGNOSIS — S13130D Subluxation of C2/C3 cervical vertebrae, subsequent encounter: Secondary | ICD-10-CM | POA: Diagnosis not present

## 2017-10-14 DIAGNOSIS — S13160D Subluxation of C5/C6 cervical vertebrae, subsequent encounter: Secondary | ICD-10-CM | POA: Diagnosis not present

## 2017-10-14 DIAGNOSIS — S13120D Subluxation of C1/C2 cervical vertebrae, subsequent encounter: Secondary | ICD-10-CM | POA: Diagnosis not present

## 2017-10-14 DIAGNOSIS — S13170D Subluxation of C6/C7 cervical vertebrae, subsequent encounter: Secondary | ICD-10-CM | POA: Diagnosis not present

## 2017-10-14 MED ORDER — TOPIRAMATE 100 MG PO TABS: 100.0000 mg | ORAL_TABLET | Freq: Two times a day (BID) | ORAL | 1 refills | Status: DC

## 2017-10-15 DIAGNOSIS — M549 Dorsalgia, unspecified: Secondary | ICD-10-CM | POA: Diagnosis not present

## 2017-10-15 DIAGNOSIS — M542 Cervicalgia: Secondary | ICD-10-CM | POA: Diagnosis not present

## 2017-10-18 DIAGNOSIS — S13130D Subluxation of C2/C3 cervical vertebrae, subsequent encounter: Secondary | ICD-10-CM | POA: Diagnosis not present

## 2017-10-18 DIAGNOSIS — S13170D Subluxation of C6/C7 cervical vertebrae, subsequent encounter: Secondary | ICD-10-CM | POA: Diagnosis not present

## 2017-10-18 DIAGNOSIS — S13160D Subluxation of C5/C6 cervical vertebrae, subsequent encounter: Secondary | ICD-10-CM | POA: Diagnosis not present

## 2017-10-18 DIAGNOSIS — S13120D Subluxation of C1/C2 cervical vertebrae, subsequent encounter: Secondary | ICD-10-CM | POA: Diagnosis not present

## 2017-10-19 DIAGNOSIS — M542 Cervicalgia: Secondary | ICD-10-CM | POA: Diagnosis not present

## 2017-10-19 DIAGNOSIS — M546 Pain in thoracic spine: Secondary | ICD-10-CM | POA: Diagnosis not present

## 2017-10-19 DIAGNOSIS — S134XXA Sprain of ligaments of cervical spine, initial encounter: Secondary | ICD-10-CM | POA: Diagnosis not present

## 2017-10-20 DIAGNOSIS — S13130D Subluxation of C2/C3 cervical vertebrae, subsequent encounter: Secondary | ICD-10-CM | POA: Diagnosis not present

## 2017-10-20 DIAGNOSIS — S13160D Subluxation of C5/C6 cervical vertebrae, subsequent encounter: Secondary | ICD-10-CM | POA: Diagnosis not present

## 2017-10-20 DIAGNOSIS — S13120D Subluxation of C1/C2 cervical vertebrae, subsequent encounter: Secondary | ICD-10-CM | POA: Diagnosis not present

## 2017-10-20 DIAGNOSIS — S13170D Subluxation of C6/C7 cervical vertebrae, subsequent encounter: Secondary | ICD-10-CM | POA: Diagnosis not present
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
## 2017-10-26 ENCOUNTER — Other Ambulatory Visit: Payer: Self-pay

## 2017-10-26 ENCOUNTER — Ambulatory Visit: Payer: BLUE CROSS/BLUE SHIELD | Admitting: Nurse Practitioner

## 2017-10-26 ENCOUNTER — Encounter: Payer: Self-pay | Admitting: Nurse Practitioner

## 2017-10-26 VITALS — BP 98/60 | HR 80 | Ht 65.5 in | Wt 294.6 lb

## 2017-10-26 DIAGNOSIS — H4710 Unspecified papilledema: Secondary | ICD-10-CM

## 2017-10-26 DIAGNOSIS — G43709 Chronic migraine without aura, not intractable, without status migrainosus: Secondary | ICD-10-CM | POA: Diagnosis not present

## 2017-10-26 DIAGNOSIS — IMO0002 Reserved for concepts with insufficient information to code with codable children: Secondary | ICD-10-CM

## 2017-10-26 MED ORDER — PROPRANOLOL HCL ER 60 MG PO CP24: 60.0000 mg | ORAL_CAPSULE | Freq: Every day | ORAL | 3 refills | Status: DC

## 2017-10-26 MED ORDER — ONDANSETRON HCL 4 MG PO TABS: ORAL_TABLET | ORAL | 0 refills | Status: DC

## 2017-10-26 MED ORDER — ELETRIPTAN HYDROBROMIDE 40 MG PO TABS: 40.0000 mg | ORAL_TABLET | ORAL | 4 refills | Status: DC | PRN
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
## 2017-10-27 DIAGNOSIS — S13120D Subluxation of C1/C2 cervical vertebrae, subsequent encounter: Secondary | ICD-10-CM | POA: Diagnosis not present

## 2017-10-27 DIAGNOSIS — S13130D Subluxation of C2/C3 cervical vertebrae, subsequent encounter: Secondary | ICD-10-CM | POA: Diagnosis not present

## 2017-10-27 DIAGNOSIS — S13160D Subluxation of C5/C6 cervical vertebrae, subsequent encounter: Secondary | ICD-10-CM | POA: Diagnosis not present

## 2017-10-27 DIAGNOSIS — S13170D Subluxation of C6/C7 cervical vertebrae, subsequent encounter: Secondary | ICD-10-CM | POA: Diagnosis not present

## 2017-10-28 DIAGNOSIS — M542 Cervicalgia: Secondary | ICD-10-CM | POA: Diagnosis not present

## 2017-11-01 DIAGNOSIS — S13130D Subluxation of C2/C3 cervical vertebrae, subsequent encounter: Secondary | ICD-10-CM | POA: Diagnosis not present

## 2017-11-01 DIAGNOSIS — S13160D Subluxation of C5/C6 cervical vertebrae, subsequent encounter: Secondary | ICD-10-CM | POA: Diagnosis not present

## 2017-11-01 DIAGNOSIS — S13170D Subluxation of C6/C7 cervical vertebrae, subsequent encounter: Secondary | ICD-10-CM | POA: Diagnosis not present

## 2017-11-01 DIAGNOSIS — S13120D Subluxation of C1/C2 cervical vertebrae, subsequent encounter: Secondary | ICD-10-CM | POA: Diagnosis not present

## 2017-11-02 DIAGNOSIS — S13130D Subluxation of C2/C3 cervical vertebrae, subsequent encounter: Secondary | ICD-10-CM | POA: Diagnosis not present

## 2017-11-02 DIAGNOSIS — S13160D Subluxation of C5/C6 cervical vertebrae, subsequent encounter: Secondary | ICD-10-CM | POA: Diagnosis not present

## 2017-11-02 DIAGNOSIS — S13120D Subluxation of C1/C2 cervical vertebrae, subsequent encounter: Secondary | ICD-10-CM | POA: Diagnosis not present

## 2017-11-02 DIAGNOSIS — S13170D Subluxation of C6/C7 cervical vertebrae, subsequent encounter: Secondary | ICD-10-CM | POA: Diagnosis not present

## 2017-11-04 DIAGNOSIS — M542 Cervicalgia: Secondary | ICD-10-CM | POA: Diagnosis not present

## 2017-11-05 DIAGNOSIS — M542 Cervicalgia: Secondary | ICD-10-CM | POA: Diagnosis not present

## 2017-11-05 DIAGNOSIS — S134XXD Sprain of ligaments of cervical spine, subsequent encounter: Secondary | ICD-10-CM | POA: Diagnosis not present

## 2017-11-08 DIAGNOSIS — S13170D Subluxation of C6/C7 cervical vertebrae, subsequent encounter: Secondary | ICD-10-CM | POA: Diagnosis not present

## 2017-11-08 DIAGNOSIS — S13120D Subluxation of C1/C2 cervical vertebrae, subsequent encounter: Secondary | ICD-10-CM | POA: Diagnosis not present

## 2017-11-08 DIAGNOSIS — S13160D Subluxation of C5/C6 cervical vertebrae, subsequent encounter: Secondary | ICD-10-CM | POA: Diagnosis not present

## 2017-11-08 DIAGNOSIS — S13130D Subluxation of C2/C3 cervical vertebrae, subsequent encounter: Secondary | ICD-10-CM | POA: Diagnosis not present

## 2017-11-09 DIAGNOSIS — S13160D Subluxation of C5/C6 cervical vertebrae, subsequent encounter: Secondary | ICD-10-CM | POA: Diagnosis not present

## 2017-11-09 DIAGNOSIS — S13120D Subluxation of C1/C2 cervical vertebrae, subsequent encounter: Secondary | ICD-10-CM | POA: Diagnosis not present

## 2017-11-09 DIAGNOSIS — M542 Cervicalgia: Secondary | ICD-10-CM | POA: Diagnosis not present

## 2017-11-09 DIAGNOSIS — S13130D Subluxation of C2/C3 cervical vertebrae, subsequent encounter: Secondary | ICD-10-CM | POA: Diagnosis not present

## 2017-11-09 DIAGNOSIS — S13170D Subluxation of C6/C7 cervical vertebrae, subsequent encounter: Secondary | ICD-10-CM | POA: Diagnosis not present

## 2017-11-12 DIAGNOSIS — M542 Cervicalgia: Secondary | ICD-10-CM | POA: Diagnosis not present

## 2017-11-15 DIAGNOSIS — S13160D Subluxation of C5/C6 cervical vertebrae, subsequent encounter: Secondary | ICD-10-CM | POA: Diagnosis not present

## 2017-11-15 DIAGNOSIS — S13130D Subluxation of C2/C3 cervical vertebrae, subsequent encounter: Secondary | ICD-10-CM | POA: Diagnosis not present

## 2017-11-15 DIAGNOSIS — S13170D Subluxation of C6/C7 cervical vertebrae, subsequent encounter: Secondary | ICD-10-CM | POA: Diagnosis not present

## 2017-11-15 DIAGNOSIS — S13120D Subluxation of C1/C2 cervical vertebrae, subsequent encounter: Secondary | ICD-10-CM | POA: Diagnosis not present

## 2017-11-16 DIAGNOSIS — M542 Cervicalgia: Secondary | ICD-10-CM | POA: Diagnosis not present

## 2017-11-17 DIAGNOSIS — S13120D Subluxation of C1/C2 cervical vertebrae, subsequent encounter: Secondary | ICD-10-CM | POA: Diagnosis not present

## 2017-11-17 DIAGNOSIS — S13160D Subluxation of C5/C6 cervical vertebrae, subsequent encounter: Secondary | ICD-10-CM | POA: Diagnosis not present

## 2017-11-17 DIAGNOSIS — S13130D Subluxation of C2/C3 cervical vertebrae, subsequent encounter: Secondary | ICD-10-CM | POA: Diagnosis not present

## 2017-11-17 DIAGNOSIS — S13170D Subluxation of C6/C7 cervical vertebrae, subsequent encounter: Secondary | ICD-10-CM | POA: Diagnosis not present

## 2017-11-18 DIAGNOSIS — M542 Cervicalgia: Secondary | ICD-10-CM | POA: Diagnosis not present

## 2017-11-19 ENCOUNTER — Other Ambulatory Visit (HOSPITAL_COMMUNITY): Payer: Self-pay | Admitting: Psychiatry

## 2017-11-19 DIAGNOSIS — F331 Major depressive disorder, recurrent, moderate: Secondary | ICD-10-CM

## 2017-11-23 DIAGNOSIS — M542 Cervicalgia: Secondary | ICD-10-CM | POA: Diagnosis not present

## 2017-11-25 ENCOUNTER — Ambulatory Visit (HOSPITAL_COMMUNITY): Payer: Self-pay | Admitting: Psychiatry

## 2017-11-26 DIAGNOSIS — M542 Cervicalgia: Secondary | ICD-10-CM | POA: Diagnosis not present

## 2017-12-03 ENCOUNTER — Other Ambulatory Visit (HOSPITAL_COMMUNITY): Payer: Self-pay | Admitting: Psychiatry

## 2017-12-03 DIAGNOSIS — F411 Generalized anxiety disorder: Secondary | ICD-10-CM

## 2017-12-03 DIAGNOSIS — M542 Cervicalgia: Secondary | ICD-10-CM | POA: Diagnosis not present

## 2017-12-03 DIAGNOSIS — F331 Major depressive disorder, recurrent, moderate: Secondary | ICD-10-CM

## 2017-12-06 DIAGNOSIS — M542 Cervicalgia: Secondary | ICD-10-CM | POA: Diagnosis not present

## 2017-12-07 DIAGNOSIS — M542 Cervicalgia: Secondary | ICD-10-CM | POA: Diagnosis not present

## 2017-12-15 DIAGNOSIS — M542 Cervicalgia: Secondary | ICD-10-CM | POA: Diagnosis not present

## 2017-12-16 DIAGNOSIS — E559 Vitamin D deficiency, unspecified: Secondary | ICD-10-CM | POA: Diagnosis not present

## 2017-12-16 DIAGNOSIS — Z0000 Encounter for general adult medical examination without abnormal findings: Secondary | ICD-10-CM | POA: Diagnosis not present

## 2017-12-16 DIAGNOSIS — I10 Essential (primary) hypertension: Secondary | ICD-10-CM | POA: Diagnosis not present

## 2017-12-16 DIAGNOSIS — Z79899 Other long term (current) drug therapy: Secondary | ICD-10-CM | POA: Diagnosis not present

## 2017-12-16 DIAGNOSIS — R7303 Prediabetes: Secondary | ICD-10-CM | POA: Diagnosis not present

## 2017-12-22 DIAGNOSIS — M542 Cervicalgia: Secondary | ICD-10-CM | POA: Diagnosis not present

## 2018-01-11 ENCOUNTER — Telehealth: Payer: Self-pay | Admitting: *Deleted

## 2018-01-11 DIAGNOSIS — Z0289 Encounter for other administrative examinations: Secondary | ICD-10-CM
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
## 2018-01-11 NOTE — Telephone Encounter (Signed)
FMLA forms on NP's desk for review, completion and signature.

## 2018-01-12 DIAGNOSIS — S134XXD Sprain of ligaments of cervical spine, subsequent encounter: Secondary | ICD-10-CM | POA: Diagnosis not present

## 2018-01-13 ENCOUNTER — Telehealth: Payer: Self-pay | Admitting: *Deleted

## 2018-01-13 NOTE — Telephone Encounter (Signed)
I faxed pt form to [employer].

## 2018-01-19 ENCOUNTER — Other Ambulatory Visit: Payer: Self-pay | Admitting: Nurse Practitioner

## 2018-01-24 ENCOUNTER — Telehealth: Payer: Self-pay | Admitting: Neurology

## 2018-01-24 NOTE — Telephone Encounter (Signed)
She would like to come in for an earlier follow up to discuss her increase in migraines.  She has been scheduled with Darrol Angelarolyn Martin, NP on 01/26/18.

## 2018-01-24 NOTE — Telephone Encounter (Signed)
Patient called in stating she needs a sooner appointment because she is have more migraines and is having dizziness.
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
## 2018-01-26 ENCOUNTER — Encounter: Payer: Self-pay | Admitting: Nurse Practitioner

## 2018-01-26 ENCOUNTER — Ambulatory Visit (INDEPENDENT_AMBULATORY_CARE_PROVIDER_SITE_OTHER): Payer: BLUE CROSS/BLUE SHIELD | Admitting: Nurse Practitioner

## 2018-01-26 VITALS — BP 150/95 | HR 70 | Ht 65.5 in | Wt 291.4 lb

## 2018-01-26 DIAGNOSIS — G43709 Chronic migraine without aura, not intractable, without status migrainosus: Secondary | ICD-10-CM | POA: Diagnosis not present

## 2018-01-26 DIAGNOSIS — IMO0002 Reserved for concepts with insufficient information to code with codable children: Secondary | ICD-10-CM

## 2018-01-26 MED ORDER — ELETRIPTAN HYDROBROMIDE 40 MG PO TABS: 40.0000 mg | ORAL_TABLET | ORAL | 4 refills | Status: DC | PRN

## 2018-01-26 MED ORDER — ONDANSETRON HCL 4 MG PO TABS: ORAL_TABLET | ORAL | 1 refills | Status: DC

## 2018-01-26 NOTE — Patient Instructions (Signed)
Continue Topamax 100 mg twice daily this is a preventive Continue Relpax 40 mg acutely as needed take at first sign of headche Restart    propanolol 60 LA mg daily this is a preventive  Continue follow up with orthopedics  Dr. Ethelene Halamos for back pain Given list of common migrane triggers Stress reduction and exercise daily by walking, slow steady weight loss Keep F/U appt with Dr Terrace ArabiaYan in March

## 2018-01-26 NOTE — Progress Notes (Signed)
I have reviewed and agreed above plan. 

## 2018-01-26 NOTE — Progress Notes (Signed)
Fax confirmation for relpax received [facility] [phone]. sy

## 2018-01-27 DIAGNOSIS — Z1231 Encounter for screening mammogram for malignant neoplasm of breast: Secondary | ICD-10-CM | POA: Diagnosis not present

## 2018-03-15 DIAGNOSIS — S134XXD Sprain of ligaments of cervical spine, subsequent encounter: Secondary | ICD-10-CM | POA: Diagnosis not present

## 2018-04-04 DIAGNOSIS — F331 Major depressive disorder, recurrent, moderate: Secondary | ICD-10-CM | POA: Diagnosis not present

## 2018-04-27 ENCOUNTER — Other Ambulatory Visit: Payer: Self-pay | Admitting: Neurology

## 2018-04-28 ENCOUNTER — Telehealth: Payer: Self-pay | Admitting: *Deleted

## 2018-04-28 ENCOUNTER — Ambulatory Visit: Payer: BLUE CROSS/BLUE SHIELD | Admitting: Neurology

## 2018-04-28 NOTE — Telephone Encounter (Signed)
No showed follow up appointment. 

## 2018-04-28 NOTE — Telephone Encounter (Signed)
Patient arrived to office at 11:43am for her 11:30am appt. She was unable to be seen and rescheduled.

## 2018-04-29 ENCOUNTER — Encounter: Payer: Self-pay | Admitting: Neurology

## 2018-05-02 ENCOUNTER — Ambulatory Visit (INDEPENDENT_AMBULATORY_CARE_PROVIDER_SITE_OTHER): Payer: BLUE CROSS/BLUE SHIELD | Admitting: Neurology

## 2018-05-02 ENCOUNTER — Encounter: Payer: Self-pay | Admitting: Neurology

## 2018-05-02 VITALS — BP 123/71 | HR 62 | Ht 65.5 in | Wt 286.0 lb

## 2018-05-02 DIAGNOSIS — G43709 Chronic migraine without aura, not intractable, without status migrainosus: Secondary | ICD-10-CM | POA: Diagnosis not present

## 2018-05-02 DIAGNOSIS — F331 Major depressive disorder, recurrent, moderate: Secondary | ICD-10-CM | POA: Diagnosis not present

## 2018-05-02 DIAGNOSIS — IMO0002 Reserved for concepts with insufficient information to code with codable children: Secondary | ICD-10-CM

## 2018-05-02 MED ORDER — TOPIRAMATE 100 MG PO TABS: 200.0000 mg | ORAL_TABLET | Freq: Every day | ORAL | 4 refills | Status: DC

## 2018-05-02 MED ORDER — ELETRIPTAN HYDROBROMIDE 40 MG PO TABS: 40.0000 mg | ORAL_TABLET | ORAL | 11 refills | Status: DC | PRN
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
## 2018-06-08 DIAGNOSIS — S134XXD Sprain of ligaments of cervical spine, subsequent encounter: Secondary | ICD-10-CM | POA: Diagnosis not present

## 2018-06-08 DIAGNOSIS — M542 Cervicalgia: Secondary | ICD-10-CM | POA: Diagnosis not present

## 2018-07-07 DIAGNOSIS — F331 Major depressive disorder, recurrent, moderate: Secondary | ICD-10-CM | POA: Diagnosis not present

## 2018-09-02 DIAGNOSIS — R05 Cough: Secondary | ICD-10-CM | POA: Diagnosis not present
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
## 2018-10-13 DIAGNOSIS — F331 Major depressive disorder, recurrent, moderate: Secondary | ICD-10-CM | POA: Diagnosis not present

## 2018-10-19 DIAGNOSIS — G894 Chronic pain syndrome: Secondary | ICD-10-CM | POA: Diagnosis not present

## 2018-10-19 DIAGNOSIS — M542 Cervicalgia: Secondary | ICD-10-CM | POA: Diagnosis not present
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
## 2018-11-02 ENCOUNTER — Encounter: Payer: Self-pay | Admitting: Neurology

## 2018-11-02 ENCOUNTER — Ambulatory Visit (INDEPENDENT_AMBULATORY_CARE_PROVIDER_SITE_OTHER): Payer: BC Managed Care – PPO | Admitting: Neurology

## 2018-11-02 ENCOUNTER — Other Ambulatory Visit: Payer: Self-pay

## 2018-11-02 VITALS — BP 138/90 | HR 75 | Temp 97.3°F | Ht 66.0 in | Wt 299.8 lb

## 2018-11-02 DIAGNOSIS — G43709 Chronic migraine without aura, not intractable, without status migrainosus: Secondary | ICD-10-CM

## 2018-11-02 DIAGNOSIS — IMO0002 Reserved for concepts with insufficient information to code with codable children: Secondary | ICD-10-CM

## 2018-11-02 MED ORDER — VENLAFAXINE HCL ER 37.5 MG PO CP24: 37.5000 mg | ORAL_CAPSULE | Freq: Every day | ORAL | 5 refills | Status: DC

## 2018-11-02 MED ORDER — PROPRANOLOL HCL ER 60 MG PO CP24: 60.0000 mg | ORAL_CAPSULE | Freq: Every day | ORAL | 3 refills | Status: DC

## 2018-11-02 MED ORDER — ONDANSETRON HCL 4 MG PO TABS: ORAL_TABLET | ORAL | 1 refills | Status: DC

## 2018-11-02 NOTE — Patient Instructions (Signed)
Continue Topamax, Inderal, Effexor for migraine prevention. When you see your eye doctor, have them send [place] the report. Consider exercise program and weight loss program.

## 2018-11-07 NOTE — Progress Notes (Signed)
I have reviewed and agreed above plan. 

## 2018-11-18 DIAGNOSIS — F332 Major depressive disorder, recurrent severe without psychotic features: Secondary | ICD-10-CM | POA: Diagnosis not present

## 2018-12-08 DIAGNOSIS — F332 Major depressive disorder, recurrent severe without psychotic features: Secondary | ICD-10-CM | POA: Diagnosis not present

## 2019-01-17 DIAGNOSIS — F331 Major depressive disorder, recurrent, moderate: Secondary | ICD-10-CM | POA: Diagnosis not present

## 2019-02-09 DIAGNOSIS — R7303 Prediabetes: Secondary | ICD-10-CM | POA: Diagnosis not present

## 2019-02-09 DIAGNOSIS — E559 Vitamin D deficiency, unspecified: Secondary | ICD-10-CM | POA: Diagnosis not present

## 2019-02-09 DIAGNOSIS — I10 Essential (primary) hypertension: Secondary | ICD-10-CM | POA: Diagnosis not present

## 2019-02-09 DIAGNOSIS — Z0000 Encounter for general adult medical examination without abnormal findings: Secondary | ICD-10-CM | POA: Diagnosis not present

## 2019-04-06 ENCOUNTER — Telehealth: Payer: Self-pay

## 2019-04-06 NOTE — Telephone Encounter (Signed)
New script for a 90 day supply of venlafaxine XR (EFFEXOR-XR) 37.5 MG 24 hr capsule has been faxed to [facility] on file. Confirmation fax has been received.

## 2019-04-07 DIAGNOSIS — F3342 Major depressive disorder, recurrent, in full remission: Secondary | ICD-10-CM | POA: Diagnosis not present

## 2019-04-25 ENCOUNTER — Telehealth: Payer: Self-pay

## 2019-04-25 ENCOUNTER — Other Ambulatory Visit: Payer: Self-pay

## 2019-04-25 MED ORDER — ELETRIPTAN HYDROBROMIDE 40 MG PO TABS: 40.0000 mg | ORAL_TABLET | ORAL | 11 refills | Status: DC | PRN

## 2019-04-25 NOTE — Telephone Encounter (Signed)
New script for eletriptan (RELPAX) 40 MG tablet has been faxed to [facility] listed in the chart. Confirmation fax has been received.

## 2019-04-28 ENCOUNTER — Other Ambulatory Visit: Payer: Self-pay

## 2019-04-28 MED ORDER — VENLAFAXINE HCL ER 37.5 MG PO CP24: 37.5000 mg | ORAL_CAPSULE | Freq: Every day | ORAL | 1 refills | Status: DC
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
## 2019-05-02 ENCOUNTER — Telehealth: Payer: Self-pay | Admitting: Neurology

## 2019-05-02 ENCOUNTER — Encounter: Payer: Self-pay | Admitting: Neurology

## 2019-05-02 ENCOUNTER — Telehealth (INDEPENDENT_AMBULATORY_CARE_PROVIDER_SITE_OTHER): Payer: BC Managed Care – PPO | Admitting: Neurology

## 2019-05-02 DIAGNOSIS — IMO0002 Reserved for concepts with insufficient information to code with codable children: Secondary | ICD-10-CM

## 2019-05-02 DIAGNOSIS — G43709 Chronic migraine without aura, not intractable, without status migrainosus: Secondary | ICD-10-CM

## 2019-05-02 MED ORDER — TOPIRAMATE 100 MG PO TABS: 200.0000 mg | ORAL_TABLET | Freq: Every day | ORAL | 4 refills | Status: DC

## 2019-05-02 MED ORDER — AJOVY 225 MG/1.5ML ~~LOC~~ SOAJ: 225.0000 mg | SUBCUTANEOUS | 11 refills | Status: DC

## 2019-05-02 NOTE — Telephone Encounter (Signed)
Please get most recent eye exam from Dr. Ronney Asters [facility]. I did VV visit with patient today. She says she saw him in December.

## 2019-05-03 NOTE — Progress Notes (Signed)
I have reviewed and agreed above plan. 

## 2019-05-08 ENCOUNTER — Telehealth: Payer: Self-pay

## 2019-05-08 NOTE — Telephone Encounter (Signed)
Received PA from [facility] for pt's ajovy. Completed paper PA that was included. Faxed to [facility]. Should have a determination in 3-5 business days.

## 2019-05-15 NOTE — Telephone Encounter (Signed)
PA for ajovy approved by [facility] from 05/15/2019-08/15/2019. I called [facility] and completed this via phone.   [facility] notified of determination.

## 2019-07-03 ENCOUNTER — Telehealth: Payer: Self-pay | Admitting: Neurology

## 2019-07-03 ENCOUNTER — Encounter: Payer: Self-pay | Admitting: Neurology

## 2019-07-03 ENCOUNTER — Other Ambulatory Visit: Payer: Self-pay

## 2019-07-03 ENCOUNTER — Ambulatory Visit: Payer: BC Managed Care – PPO | Admitting: Neurology

## 2019-07-03 VITALS — BP 142/80 | HR 57 | Temp 97.0°F | Ht 66.0 in | Wt 298.0 lb

## 2019-07-03 DIAGNOSIS — IMO0002 Reserved for concepts with insufficient information to code with codable children: Secondary | ICD-10-CM

## 2019-07-03 DIAGNOSIS — G43709 Chronic migraine without aura, not intractable, without status migrainosus: Secondary | ICD-10-CM

## 2019-07-03 MED ORDER — PROPRANOLOL HCL ER 60 MG PO CP24: 60.0000 mg | ORAL_CAPSULE | Freq: Every day | ORAL | 3 refills | Status: DC
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
## 2019-07-03 NOTE — Patient Instructions (Signed)
It was great to see you today! Continue current medication, continue on Ajovy  Let me know in 6 weeks how you are doing on Ajovy  See you back in 3 months or sooner if needed

## 2019-07-03 NOTE — Telephone Encounter (Signed)
Please try to get records from Dr. Ronney Asters at [facility], most recent eye exam in December 2020, this is our second attempt.

## 2019-07-03 NOTE — Telephone Encounter (Signed)
Pt notes to be faxed over today.

## 2019-07-04 NOTE — Progress Notes (Signed)
I have reviewed and agreed above plan. 

## 2019-07-05 NOTE — Telephone Encounter (Signed)
Received notes from ophthalmology exam December 07, 2018, Dr. Fredrich Birks.  Indicates pseudopapilledema of optic disc bilaterally is stable. Right eye optic nerve margins inferior indistinct; left eye superior indistinct.

## 2019-07-13 DIAGNOSIS — F4321 Adjustment disorder with depressed mood: Secondary | ICD-10-CM | POA: Diagnosis not present

## 2019-07-13 DIAGNOSIS — F3341 Major depressive disorder, recurrent, in partial remission: Secondary | ICD-10-CM | POA: Diagnosis not present

## 2019-07-20 DIAGNOSIS — F3341 Major depressive disorder, recurrent, in partial remission: Secondary | ICD-10-CM | POA: Diagnosis not present

## 2019-09-14 ENCOUNTER — Telehealth: Payer: Self-pay | Admitting: *Deleted

## 2019-09-14 NOTE — Telephone Encounter (Signed)
Aojvy PA form faxed to [facility].

## 2019-09-14 NOTE — Telephone Encounter (Signed)
Form signed for Ajovy.

## 2019-09-14 NOTE — Telephone Encounter (Signed)
Received fax from [facility], re: Ajovy PA questionnaire. Clinical questions answered, placed on NP's desk for review, signature.

## 2019-09-15 NOTE — Telephone Encounter (Signed)
Pt called for an update on her Ajovy.  The messages from [facility] were relayed to her.  Pt states she uses [facility], she would like a call from [place] on Monday

## 2019-09-18 NOTE — Telephone Encounter (Signed)
Pt called pharmacy [facility] [phone] denied refill for Fremanezumab-vfrm (AJOVY) 225 MG/1.5ML SOAJ there was a issue with [employer]. [facility] advised Pt to call [facility]. Pt would like to call from the nurse.

## 2019-09-18 NOTE — Telephone Encounter (Signed)
LVM informing patient her Rx was sent to [facility], [place] is pending. We should receive a fax when her insurance makes a decision. Left # for questions.

## 2019-09-18 NOTE — Telephone Encounter (Signed)
Received additional questions from [facility],  insurance re: Ajovy PA. Clinical questions answered, signed and faxed back. Sent her my chart to advise of PA still in process.

## 2019-09-19 NOTE — Telephone Encounter (Signed)
Received fax from [facility]. PA for Ajovy has been approved from 09/18/19 to 09/17/20 as long as she maintains coverage. Faxed a copy of the approval letter to [employer] on [address].   Nothing further needed.

## 2019-10-05 ENCOUNTER — Ambulatory Visit: Payer: BC Managed Care – PPO | Admitting: Neurology

## 2019-10-05 ENCOUNTER — Encounter: Payer: Self-pay | Admitting: Neurology

## 2019-10-05 VITALS — BP 169/89 | HR 63 | Ht 66.0 in | Wt 298.0 lb

## 2019-10-05 DIAGNOSIS — G43709 Chronic migraine without aura, not intractable, without status migrainosus: Secondary | ICD-10-CM

## 2019-10-05 DIAGNOSIS — IMO0002 Reserved for concepts with insufficient information to code with codable children: Secondary | ICD-10-CM

## 2019-10-05 NOTE — Patient Instructions (Signed)
Continue current medications  Please see your eye doctor for evaluation  Keep me posted on your headaches Recommend exercise, weight loss  See you back in 6 months
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
## 2019-10-19 DIAGNOSIS — F331 Major depressive disorder, recurrent, moderate: Secondary | ICD-10-CM | POA: Diagnosis not present

## 2019-12-06 ENCOUNTER — Telehealth: Payer: Self-pay | Admitting: *Deleted

## 2019-12-06 NOTE — Telephone Encounter (Signed)
Received fax by [facility] by Dr. Fredrich Birks.  He saw pt 12-04-19 fo comprehensive eye exam today.  Pt has hx of IIH.  Her retinal exam was essentially normal with no evidence of optic disc edema in either eye.  Will see her 12-14-20 again next yr for exam.  [phone], fax [phone].

## 2019-12-13 DIAGNOSIS — F332 Major depressive disorder, recurrent severe without psychotic features: Secondary | ICD-10-CM | POA: Diagnosis not present

## 2019-12-21 DIAGNOSIS — D529 Folate deficiency anemia, unspecified: Secondary | ICD-10-CM | POA: Diagnosis not present

## 2019-12-21 DIAGNOSIS — E785 Hyperlipidemia, unspecified: Secondary | ICD-10-CM | POA: Diagnosis not present

## 2019-12-21 DIAGNOSIS — R7309 Other abnormal glucose: Secondary | ICD-10-CM | POA: Diagnosis not present

## 2019-12-21 DIAGNOSIS — E559 Vitamin D deficiency, unspecified: Secondary | ICD-10-CM | POA: Diagnosis not present

## 2019-12-28 DIAGNOSIS — E119 Type 2 diabetes mellitus without complications: Secondary | ICD-10-CM | POA: Diagnosis not present

## 2019-12-28 DIAGNOSIS — Z23 Encounter for immunization: Secondary | ICD-10-CM | POA: Diagnosis not present

## 2020-01-04 DIAGNOSIS — F3341 Major depressive disorder, recurrent, in partial remission: Secondary | ICD-10-CM | POA: Diagnosis not present

## 2020-02-22 DIAGNOSIS — Z20822 Contact with and (suspected) exposure to covid-19: Secondary | ICD-10-CM | POA: Diagnosis not present

## 2020-02-22 DIAGNOSIS — R52 Pain, unspecified: Secondary | ICD-10-CM | POA: Diagnosis not present

## 2020-03-02 ENCOUNTER — Other Ambulatory Visit: Payer: BC Managed Care – PPO

## 2020-03-02 ENCOUNTER — Other Ambulatory Visit: Payer: Self-pay

## 2020-03-02 DIAGNOSIS — Z20822 Contact with and (suspected) exposure to covid-19: Secondary | ICD-10-CM

## 2020-03-03 DIAGNOSIS — Z20822 Contact with and (suspected) exposure to covid-19: Secondary | ICD-10-CM | POA: Diagnosis not present

## 2020-03-04 DIAGNOSIS — R0602 Shortness of breath: Secondary | ICD-10-CM | POA: Diagnosis not present

## 2020-03-07 LAB — NOVEL CORONAVIRUS, NAA: SARS-CoV-2, NAA: NOT DETECTED

## 2020-03-08 DIAGNOSIS — J029 Acute pharyngitis, unspecified: Secondary | ICD-10-CM | POA: Diagnosis not present

## 2020-03-08 DIAGNOSIS — Z01818 Encounter for other preprocedural examination: Secondary | ICD-10-CM | POA: Diagnosis not present

## 2020-03-24 ENCOUNTER — Encounter (INDEPENDENT_AMBULATORY_CARE_PROVIDER_SITE_OTHER): Payer: Self-pay

## 2020-03-29 NOTE — Progress Notes (Signed)
I have reviewed and agreed above plan. 

## 2020-04-04 ENCOUNTER — Telehealth: Payer: Self-pay | Admitting: Neurology

## 2020-04-04 NOTE — Telephone Encounter (Signed)
Noted  

## 2020-04-04 NOTE — Telephone Encounter (Signed)
..   Pt understands that although there may be some limitations with this type of visit, we will take all precautions to reduce any security or privacy concerns.  Pt understands that this will be treated like an in office visit and we will file with pt's insurance, and there may be a patient responsible charge related to this service.  Pt was not able to come to the office due to work schedule.

## 2020-04-05 DIAGNOSIS — E559 Vitamin D deficiency, unspecified: Secondary | ICD-10-CM | POA: Diagnosis not present

## 2020-04-05 DIAGNOSIS — E119 Type 2 diabetes mellitus without complications: Secondary | ICD-10-CM | POA: Diagnosis not present

## 2020-04-05 DIAGNOSIS — Z79899 Other long term (current) drug therapy: Secondary | ICD-10-CM | POA: Diagnosis not present

## 2020-04-08 ENCOUNTER — Encounter: Payer: Self-pay | Admitting: Neurology

## 2020-04-08 ENCOUNTER — Telehealth (INDEPENDENT_AMBULATORY_CARE_PROVIDER_SITE_OTHER): Payer: BC Managed Care – PPO | Admitting: Neurology

## 2020-04-08 DIAGNOSIS — G43709 Chronic migraine without aura, not intractable, without status migrainosus: Secondary | ICD-10-CM

## 2020-04-08 DIAGNOSIS — H4710 Unspecified papilledema: Secondary | ICD-10-CM

## 2020-04-08 MED ORDER — ONDANSETRON HCL 4 MG PO TABS: ORAL_TABLET | ORAL | 1 refills | Status: DC

## 2020-04-08 MED ORDER — ELETRIPTAN HYDROBROMIDE 40 MG PO TABS: 40.0000 mg | ORAL_TABLET | ORAL | 11 refills | Status: DC | PRN

## 2020-04-08 MED ORDER — AJOVY 225 MG/1.5ML ~~LOC~~ SOAJ: 225.0000 mg | SUBCUTANEOUS | 11 refills | Status: DC

## 2020-04-08 MED ORDER — TOPIRAMATE 100 MG PO TABS: 200.0000 mg | ORAL_TABLET | Freq: Every day | ORAL | 4 refills | Status: DC

## 2020-04-08 MED ORDER — PROPRANOLOL HCL ER 60 MG PO CP24: 60.0000 mg | ORAL_CAPSULE | Freq: Every day | ORAL | 3 refills | Status: DC
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
## 2020-04-10 DIAGNOSIS — Z0000 Encounter for general adult medical examination without abnormal findings: Secondary | ICD-10-CM | POA: Diagnosis not present

## 2020-04-10 DIAGNOSIS — E119 Type 2 diabetes mellitus without complications: Secondary | ICD-10-CM | POA: Diagnosis not present

## 2020-04-11 ENCOUNTER — Telehealth: Payer: Self-pay | Admitting: *Deleted

## 2020-04-11 NOTE — Telephone Encounter (Signed)
Received call about her medications taking propranolol which we do have her on, ? Atenolol.  I see has a historical addition in her medlist.  I called and LMVM for her to return call.

## 2020-05-04 DIAGNOSIS — F3342 Major depressive disorder, recurrent, in full remission: Secondary | ICD-10-CM | POA: Diagnosis not present

## 2020-05-04 DIAGNOSIS — F411 Generalized anxiety disorder: Secondary | ICD-10-CM | POA: Diagnosis not present

## 2020-07-03 DIAGNOSIS — J040 Acute laryngitis: Secondary | ICD-10-CM | POA: Diagnosis not present

## 2020-07-03 DIAGNOSIS — R5383 Other fatigue: Secondary | ICD-10-CM | POA: Diagnosis not present

## 2020-07-03 DIAGNOSIS — J209 Acute bronchitis, unspecified: Secondary | ICD-10-CM | POA: Diagnosis not present

## 2020-07-04 DIAGNOSIS — Z20822 Contact with and (suspected) exposure to covid-19: Secondary | ICD-10-CM | POA: Diagnosis not present

## 2020-07-09 DIAGNOSIS — J019 Acute sinusitis, unspecified: Secondary | ICD-10-CM | POA: Diagnosis not present

## 2020-07-24 ENCOUNTER — Ambulatory Visit
Admission: RE | Admit: 2020-07-24 | Discharge: 2020-07-24 | Disposition: A | Discharge status: Home / Self Care | Payer: BC Managed Care – PPO | Source: Ambulatory Visit | Attending: Family Medicine | Admitting: Family Medicine

## 2020-07-24 ENCOUNTER — Other Ambulatory Visit: Payer: Self-pay | Admitting: Family Medicine

## 2020-07-24 DIAGNOSIS — R499 Unspecified voice and resonance disorder: Secondary | ICD-10-CM | POA: Diagnosis not present

## 2020-07-24 DIAGNOSIS — R0789 Other chest pain: Secondary | ICD-10-CM

## 2020-07-24 DIAGNOSIS — R0602 Shortness of breath: Secondary | ICD-10-CM | POA: Diagnosis not present

## 2020-08-25 DIAGNOSIS — S8391XA Sprain of unspecified site of right knee, initial encounter: Secondary | ICD-10-CM | POA: Diagnosis not present

## 2020-09-02 ENCOUNTER — Telehealth: Payer: Self-pay

## 2020-09-02 ENCOUNTER — Ambulatory Visit (INDEPENDENT_AMBULATORY_CARE_PROVIDER_SITE_OTHER): Payer: BC Managed Care – PPO | Admitting: Otolaryngology

## 2020-09-02 ENCOUNTER — Other Ambulatory Visit: Payer: Self-pay

## 2020-09-02 DIAGNOSIS — R0989 Other specified symptoms and signs involving the circulatory and respiratory systems: Secondary | ICD-10-CM

## 2020-09-02 DIAGNOSIS — R198 Other specified symptoms and signs involving the digestive system and abdomen: Secondary | ICD-10-CM

## 2020-09-02 MED ORDER — ONDANSETRON HCL 4 MG PO TABS: ORAL_TABLET | ORAL | 1 refills | Status: DC

## 2020-09-02 NOTE — Progress Notes (Signed)
HPI: Lometa Riggin is a 45 y.o. female who presents is referred by her PCP for evaluation of throat complaints.  She stated that she has had intermittent sore throat initially began back in November.  She had another sore throat in May.  She also lost her voice during those episodes.  She is not having a sore throat today.  She is having no trouble eating or swallowing presently.  She does state that she snores but had a sleep test that did not demonstrate significant obstructive sleep apnea.  She is obese.Marland Kitchen  Past Medical History:  Diagnosis Date   Anxiety    Depression    Headache(784.0)    Hypertension    Migraine    Papilledema, right eye    Vitamin D deficiency    Past Surgical History:  Procedure Laterality Date   EYE SURGERY     Social History   Socioeconomic History   Marital status: Single    Spouse name: Not on file   Number of children: 0   Years of education: some college   Highest education level: Not on file  Occupational History   Not on file  Tobacco Use   Smoking status: Never   Smokeless tobacco: Never  Vaping Use   Vaping Use: Never used  Substance and Sexual Activity   Alcohol use: Yes    Comment: occ   Drug use: No   Sexual activity: Never    Birth control/protection: None  Other Topics Concern   Not on file  Social History Narrative   Lives at home alone.   Right-handed.   3-4 cups caffeine per day.   Social Determinants of Health   Financial Resource Strain: Not on file  Food Insecurity: Not on file  Transportation Needs: Not on file  Physical Activity: Not on file  Stress: Not on file  Social Connections: Not on file   Family History  Problem Relation Age of Onset   Alcohol abuse Paternal Grandfather    Healthy Mother    Diabetes Father    Anxiety disorder Neg Hx    Bipolar disorder Neg Hx    Depression Neg Hx    Suicidality Neg Hx    No Known Allergies Prior to Admission medications   Medication Sig Start Date End Date Taking?  Authorizing Provider  Ascorbic Acid (VITAMIN C) 100 MG tablet Take 100 mg by mouth daily.    [provider]  aspirin-acetaminophen-caffeine (EXCEDRIN MIGRAINE) [phone] MG tablet Take 2 tablets by mouth every 6 (six) hours as needed for headache.    [provider]  atenolol (TENORMIN) 25 MG tablet TK 1 T PO QD  (taking 1/2 tab daily) 05/26/17   [provider]  calcium carbonate (TUMS - DOSED IN MG ELEMENTAL CALCIUM) 500 MG chewable tablet Chew 2 tablets by mouth daily.      [provider]  cyclobenzaprine (FLEXERIL) 10 MG tablet Take 1 tablet (10 mg total) by mouth 2 (two) times daily as needed for muscle spasms. 09/10/17   Janne Napoleon, NP  eletriptan (RELPAX) 40 MG tablet Take 1 tablet (40 mg total) by mouth as needed for migraine or headache. May repeat in 2 hours if headache persists or recurs. No more than 2 in 24 hours 04/08/20   Glean Salvo, NP  Fremanezumab-vfrm (AJOVY) 225 MG/1.5ML SOAJ Inject 225 mg into the skin every 30 (thirty) days. 04/08/20   Glean Salvo, NP  meloxicam (MOBIC) 15 MG tablet TK 1 T  PO QD 06/01/18   [provider]  Multiple Vitamin (MULTIVITAMIN) tablet Take 1 tablet by mouth daily.      [provider]  ondansetron (ZOFRAN) 4 MG tablet TAKE 1 TABLET(4 MG) BY MOUTH EVERY 8 HOURS AS NEEDED FOR NAUSEA OR VOMITING 09/02/20   Glean Salvo, NP  propranolol ER (INDERAL LA) 60 MG 24 hr capsule Take 1 capsule (60 mg total) by mouth daily. 04/08/20   Glean Salvo, NP  topiramate (TOPAMAX) 100 MG tablet Take 2 tablets (200 mg total) by mouth at bedtime. 04/08/20   Glean Salvo, NP  triamterene-hydrochlorothiazide (DYAZIDE) 37.5-25 MG capsule Take 1 capsule by mouth daily.    [provider]  vitamin B-12 (CYANOCOBALAMIN) 100 MCG tablet Take 100 mcg by mouth daily.    [provider]  vortioxetine HBr (TRINTELLIX) 20 MG TABS tablet Take 20 mg by mouth daily.    [provider]     Positive  ROS: Otherwise negative  All other systems have been reviewed and were otherwise negative with the exception of those mentioned in the HPI and as above.  Physical Exam: Constitutional: Alert, well-appearing, no acute distress.  She is not hoarse today. Ears: External ears without lesions or tenderness. Ear canals are clear bilaterally with intact, clear TMs bilaterally with no middle ear effusion.  Hearing screening with a tuning forks revealed symmetric hearing with AC > BC bilaterally. Nasal: External nose without lesions. Septum with minimal deformity and mild rhinitis.. Clear nasal passages otherwise. Oral: Lips and gums without lesions. Tongue and palate mucosa without lesions. Posterior oropharynx clear.  Tonsil regions appear benign bilaterally.  Indirect laryngoscopy revealed a clear base of tongue vallecula and epiglottis.  Vocal cords were clear bilaterally with normal vocal cord mobility. Neck: No palpable adenopathy or masses.  She has no significant palpable adenopathy on either side of her neck. Respiratory: Breathing comfortably  Skin: No facial/neck lesions or rash noted.  Procedures  Assessment: Normal upper airway examination with no signs of infection or evidence of neoplasm.  Symptoms possibly could be related to reflux disease.  Plan: Reviewed with her concerning normal upper airway examination with no evidence of active infection or neoplasm.  I discussed with her concerning normal examination of the vocal cords. She will follow-up the next time she has a bad sore throat or loses her voice for reexamination as needed. Briefly discussed with her concerning working on weight loss which she is presently working on.   Narda Bonds, MD   CC:

## 2020-09-02 NOTE — Telephone Encounter (Signed)
Refill sent to pharmacy.   

## 2020-10-03 ENCOUNTER — Telehealth: Payer: Self-pay | Admitting: *Deleted

## 2020-10-03 NOTE — Telephone Encounter (Signed)
PA for Ajovy 225mg  started on covermymeds (key: [id number]). Pt has pharmacy coverage through [facility] [employer] [phone]). Decision pending.

## 2020-10-07 NOTE — Telephone Encounter (Signed)
PA Case ID: [id number] approved through 10/03/2021.

## 2020-10-09 ENCOUNTER — Ambulatory Visit: Payer: BC Managed Care – PPO | Admitting: Neurology

## 2020-10-10 ENCOUNTER — Ambulatory Visit: Payer: BC Managed Care – PPO | Admitting: Neurology

## 2020-10-10 ENCOUNTER — Encounter: Payer: Self-pay | Admitting: *Deleted

## 2020-10-25 DIAGNOSIS — L03032 Cellulitis of left toe: Secondary | ICD-10-CM | POA: Diagnosis not present

## 2020-11-20 DIAGNOSIS — F3342 Major depressive disorder, recurrent, in full remission: Secondary | ICD-10-CM | POA: Diagnosis not present

## 2020-11-25 ENCOUNTER — Telehealth: Payer: Self-pay | Admitting: Neurology

## 2020-11-25 NOTE — Telephone Encounter (Signed)
Lvm for patient to call back to reschedule and sent mychart message-Dr. Terrace Arabia will be out this day.

## 2020-12-02 ENCOUNTER — Ambulatory Visit: Payer: BC Managed Care – PPO | Admitting: Neurology

## 2021-02-27 DIAGNOSIS — M25561 Pain in right knee: Secondary | ICD-10-CM | POA: Diagnosis not present

## 2021-02-27 DIAGNOSIS — M170 Bilateral primary osteoarthritis of knee: Secondary | ICD-10-CM | POA: Diagnosis not present

## 2021-03-26 DIAGNOSIS — E119 Type 2 diabetes mellitus without complications: Secondary | ICD-10-CM | POA: Diagnosis not present

## 2021-03-26 DIAGNOSIS — R0989 Other specified symptoms and signs involving the circulatory and respiratory systems: Secondary | ICD-10-CM | POA: Diagnosis not present

## 2021-03-26 DIAGNOSIS — I10 Essential (primary) hypertension: Secondary | ICD-10-CM | POA: Diagnosis not present

## 2021-03-26 DIAGNOSIS — R5381 Other malaise: Secondary | ICD-10-CM | POA: Diagnosis not present

## 2021-04-21 DIAGNOSIS — J019 Acute sinusitis, unspecified: Secondary | ICD-10-CM | POA: Diagnosis not present

## 2021-05-02 DIAGNOSIS — E119 Type 2 diabetes mellitus without complications: Secondary | ICD-10-CM | POA: Diagnosis not present

## 2021-05-05 DIAGNOSIS — E559 Vitamin D deficiency, unspecified: Secondary | ICD-10-CM | POA: Diagnosis not present

## 2021-05-05 DIAGNOSIS — E119 Type 2 diabetes mellitus without complications: Secondary | ICD-10-CM | POA: Diagnosis not present

## 2021-05-05 DIAGNOSIS — Z0000 Encounter for general adult medical examination without abnormal findings: Secondary | ICD-10-CM | POA: Diagnosis not present

## 2021-05-05 DIAGNOSIS — Z79899 Other long term (current) drug therapy: Secondary | ICD-10-CM | POA: Diagnosis not present

## 2021-05-05 DIAGNOSIS — I10 Essential (primary) hypertension: Secondary | ICD-10-CM | POA: Diagnosis not present

## 2021-05-07 ENCOUNTER — Ambulatory Visit (INDEPENDENT_AMBULATORY_CARE_PROVIDER_SITE_OTHER): Payer: BC Managed Care – PPO | Admitting: Neurology

## 2021-05-07 ENCOUNTER — Telehealth: Payer: Self-pay | Admitting: Neurology

## 2021-05-07 ENCOUNTER — Other Ambulatory Visit: Payer: Self-pay | Admitting: Neurology

## 2021-05-07 ENCOUNTER — Other Ambulatory Visit: Payer: Self-pay | Admitting: *Deleted

## 2021-05-07 ENCOUNTER — Encounter: Payer: Self-pay | Admitting: Neurology

## 2021-05-07 ENCOUNTER — Other Ambulatory Visit: Payer: Self-pay

## 2021-05-07 VITALS — BP 125/79 | HR 68 | Ht 66.0 in | Wt 293.5 lb

## 2021-05-07 DIAGNOSIS — G43709 Chronic migraine without aura, not intractable, without status migrainosus: Secondary | ICD-10-CM | POA: Diagnosis not present

## 2021-05-07 DIAGNOSIS — M542 Cervicalgia: Secondary | ICD-10-CM | POA: Insufficient documentation

## 2021-05-07 DIAGNOSIS — R600 Localized edema: Secondary | ICD-10-CM | POA: Insufficient documentation

## 2021-05-07 DIAGNOSIS — H4710 Unspecified papilledema: Secondary | ICD-10-CM | POA: Diagnosis not present

## 2021-05-07 DIAGNOSIS — I10 Essential (primary) hypertension: Secondary | ICD-10-CM | POA: Insufficient documentation

## 2021-05-07 DIAGNOSIS — E119 Type 2 diabetes mellitus without complications: Secondary | ICD-10-CM | POA: Insufficient documentation

## 2021-05-07 DIAGNOSIS — E559 Vitamin D deficiency, unspecified: Secondary | ICD-10-CM | POA: Insufficient documentation

## 2021-05-07 DIAGNOSIS — N915 Oligomenorrhea, unspecified: Secondary | ICD-10-CM | POA: Insufficient documentation

## 2021-05-07 DIAGNOSIS — M549 Dorsalgia, unspecified: Secondary | ICD-10-CM | POA: Insufficient documentation

## 2021-05-07 DIAGNOSIS — G43909 Migraine, unspecified, not intractable, without status migrainosus: Secondary | ICD-10-CM | POA: Insufficient documentation

## 2021-05-07 MED ORDER — TOPIRAMATE 100 MG PO TABS: 200.0000 mg | ORAL_TABLET | Freq: Every day | ORAL | 4 refills | Status: DC

## 2021-05-07 MED ORDER — ONDANSETRON HCL 4 MG PO TABS: ORAL_TABLET | ORAL | 1 refills | Status: DC

## 2021-05-07 MED ORDER — PROPRANOLOL HCL ER 60 MG PO CP24: 60.0000 mg | ORAL_CAPSULE | Freq: Every day | ORAL | 3 refills | Status: DC

## 2021-05-07 MED ORDER — ELETRIPTAN HYDROBROMIDE 40 MG PO TABS: 40.0000 mg | ORAL_TABLET | ORAL | 11 refills | Status: DC | PRN

## 2021-05-07 MED ORDER — AJOVY 225 MG/1.5ML ~~LOC~~ SOAJ: 225.0000 mg | SUBCUTANEOUS | 11 refills | Status: DC

## 2021-05-07 MED ORDER — ELETRIPTAN HYDROBROMIDE 40 MG PO TABS: ORAL_TABLET | ORAL | 11 refills | Status: DC

## 2021-05-07 NOTE — Telephone Encounter (Signed)
Please get recent eye exam from Dr .Lorin Picket, eye doctor. Thanks!! ?

## 2021-05-07 NOTE — Patient Instructions (Signed)
I will get records from Dr .Lorin Picket ?For now continue current medications ?Work on weight loss ? See you back in 6 months  ?
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
## 2021-05-07 NOTE — Telephone Encounter (Signed)
Request made today  

## 2021-05-08 ENCOUNTER — Other Ambulatory Visit: Payer: Self-pay | Admitting: Neurology

## 2021-05-30 ENCOUNTER — Other Ambulatory Visit: Payer: Self-pay | Admitting: Family Medicine

## 2021-05-30 DIAGNOSIS — Z1231 Encounter for screening mammogram for malignant neoplasm of breast: Secondary | ICD-10-CM

## 2021-06-05 ENCOUNTER — Telehealth: Payer: Self-pay | Admitting: Neurology

## 2021-06-05 DIAGNOSIS — T7840XA Allergy, unspecified, initial encounter: Secondary | ICD-10-CM | POA: Diagnosis not present

## 2021-06-05 DIAGNOSIS — R059 Cough, unspecified: Secondary | ICD-10-CM | POA: Diagnosis not present

## 2021-06-05 DIAGNOSIS — J019 Acute sinusitis, unspecified: Secondary | ICD-10-CM | POA: Diagnosis not present

## 2021-06-05 NOTE — Telephone Encounter (Signed)
Pt is requesting a refill for Fremanezumab-vfrm (AJOVY) 225 MG/1.5ML SOAJ. ? ?[phone]  ? ?

## 2021-06-05 NOTE — Telephone Encounter (Signed)
Pt has 10 refills on file, please call pharmacy to get next refill.  ?

## 2021-06-06 ENCOUNTER — Ambulatory Visit
Admission: RE | Admit: 2021-06-06 | Discharge: 2021-06-06 | Disposition: A | Discharge status: Home / Self Care | Payer: BC Managed Care – PPO | Source: Ambulatory Visit | Attending: Family Medicine | Admitting: Family Medicine

## 2021-06-06 DIAGNOSIS — F411 Generalized anxiety disorder: Secondary | ICD-10-CM | POA: Diagnosis not present

## 2021-06-06 DIAGNOSIS — Z1231 Encounter for screening mammogram for malignant neoplasm of breast: Secondary | ICD-10-CM | POA: Diagnosis not present

## 2021-06-06 DIAGNOSIS — F3342 Major depressive disorder, recurrent, in full remission: Secondary | ICD-10-CM | POA: Diagnosis not present

## 2021-06-26 ENCOUNTER — Other Ambulatory Visit: Payer: Self-pay | Admitting: Family Medicine

## 2021-06-26 DIAGNOSIS — R928 Other abnormal and inconclusive findings on diagnostic imaging of breast: Secondary | ICD-10-CM

## 2021-07-04 DIAGNOSIS — Z862 Personal history of diseases of the blood and blood-forming organs and certain disorders involving the immune mechanism: Secondary | ICD-10-CM | POA: Diagnosis not present

## 2021-07-04 DIAGNOSIS — E119 Type 2 diabetes mellitus without complications: Secondary | ICD-10-CM | POA: Diagnosis not present

## 2021-07-04 DIAGNOSIS — R209 Unspecified disturbances of skin sensation: Secondary | ICD-10-CM | POA: Diagnosis not present

## 2021-07-04 DIAGNOSIS — Z79899 Other long term (current) drug therapy: Secondary | ICD-10-CM | POA: Diagnosis not present

## 2021-07-04 DIAGNOSIS — Z23 Encounter for immunization: Secondary | ICD-10-CM | POA: Diagnosis not present

## 2021-07-07 ENCOUNTER — Ambulatory Visit
Admission: RE | Admit: 2021-07-07 | Discharge: 2021-07-07 | Disposition: A | Discharge status: Home / Self Care | Payer: BC Managed Care – PPO | Source: Ambulatory Visit | Attending: Family Medicine | Admitting: Family Medicine

## 2021-07-07 ENCOUNTER — Other Ambulatory Visit: Payer: Self-pay | Admitting: Family Medicine

## 2021-07-07 DIAGNOSIS — R928 Other abnormal and inconclusive findings on diagnostic imaging of breast: Secondary | ICD-10-CM

## 2021-07-07 DIAGNOSIS — N6489 Other specified disorders of breast: Secondary | ICD-10-CM | POA: Diagnosis not present

## 2021-08-19 ENCOUNTER — Other Ambulatory Visit: Payer: Self-pay | Admitting: Neurology

## 2021-09-07 ENCOUNTER — Other Ambulatory Visit: Payer: Self-pay | Admitting: Neurology

## 2021-10-01 ENCOUNTER — Encounter (INDEPENDENT_AMBULATORY_CARE_PROVIDER_SITE_OTHER): Payer: Self-pay

## 2021-10-28 ENCOUNTER — Telehealth: Payer: Self-pay | Admitting: Neurology

## 2021-10-28 ENCOUNTER — Telehealth: Payer: Self-pay

## 2021-10-28 NOTE — Telephone Encounter (Signed)
Pt is calling. Stated she need a prior authorization for AJOVY. Pt is requesting refill be sent to [facility] on [address].

## 2021-10-28 NOTE — Telephone Encounter (Signed)
A PA for AJOVY (fremanezumab-vfrm) injection 225MG /1.5ML auto-injectors has been started on CMM.  Key: - PA Case [id number]  It is awaiting authorization.

## 2021-10-28 NOTE — Telephone Encounter (Signed)
A PA for Ajovy has been started on CMM. It is awaiting authorization. Key #: [id number]

## 2021-10-29 MED ORDER — AJOVY 225 MG/1.5ML ~~LOC~~ SOAJ: 225.0000 mg | SUBCUTANEOUS | 2 refills | Status: DC

## 2021-10-29 NOTE — Addendum Note (Signed)
Addended by: Christophe Louis E on: 10/29/2021 07:49 AM   Modules accepted: Orders

## 2021-10-29 NOTE — Telephone Encounter (Signed)
This request is approved from 10/28/2021 to 10/28/2022. Rx sent to pharmacy.

## 2021-10-29 NOTE — Telephone Encounter (Signed)
This request is approved from 10/28/2021 to 10/28/2022.
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
## 2021-11-12 ENCOUNTER — Encounter: Payer: Self-pay | Admitting: Neurology

## 2021-11-12 ENCOUNTER — Ambulatory Visit: Payer: BC Managed Care – PPO | Admitting: Neurology

## 2021-11-12 VITALS — BP 162/90 | HR 71 | Ht 66.0 in | Wt 284.0 lb

## 2021-11-12 DIAGNOSIS — H4710 Unspecified papilledema: Secondary | ICD-10-CM | POA: Diagnosis not present

## 2021-11-12 DIAGNOSIS — G43709 Chronic migraine without aura, not intractable, without status migrainosus: Secondary | ICD-10-CM

## 2021-11-12 MED ORDER — AJOVY 225 MG/1.5ML ~~LOC~~ SOAJ: 225.0000 mg | SUBCUTANEOUS | 11 refills | Status: DC

## 2021-11-12 MED ORDER — PROPRANOLOL HCL ER 60 MG PO CP24: 60.0000 mg | ORAL_CAPSULE | Freq: Every day | ORAL | 3 refills | Status: DC

## 2021-11-12 MED ORDER — TOPIRAMATE 100 MG PO TABS: 200.0000 mg | ORAL_TABLET | Freq: Every day | ORAL | 4 refills | Status: DC

## 2021-11-12 MED ORDER — ELETRIPTAN HYDROBROMIDE 40 MG PO TABS: ORAL_TABLET | ORAL | 11 refills | Status: DC

## 2021-11-12 NOTE — Patient Instructions (Signed)
Please follow up with Dr. Nicki Reaper  Keep medications the same Call if headaches increase Continue to work on weight loss See you back in 6 months

## 2021-11-14 ENCOUNTER — Other Ambulatory Visit (HOSPITAL_BASED_OUTPATIENT_CLINIC_OR_DEPARTMENT_OTHER): Payer: Self-pay

## 2021-11-14 DIAGNOSIS — E119 Type 2 diabetes mellitus without complications: Secondary | ICD-10-CM | POA: Diagnosis not present

## 2021-11-14 DIAGNOSIS — I10 Essential (primary) hypertension: Secondary | ICD-10-CM | POA: Diagnosis not present

## 2021-11-14 MED ORDER — WEGOVY 2.4 MG/0.75ML ~~LOC~~ SOAJ
2.4000 mg | SUBCUTANEOUS | 3 refills | Status: DC
  Filled 2021-11-14: qty 3, 28d supply, fill #0
  Filled 2021-12-15: qty 3, 28d supply, fill #1
  Filled 2022-01-13: qty 3, 28d supply, fill #2
  Filled 2022-02-12: qty 3, 28d supply, fill #3
  Filled 2022-03-14: qty 3, 28d supply, fill #4
  Filled 2022-04-14: qty 3, 28d supply, fill #5
  Filled 2022-05-14: qty 3, 28d supply, fill #6

## 2021-11-14 MED ORDER — TELMISARTAN-HCTZ 80-12.5 MG PO TABS
1.0000 | ORAL_TABLET | Freq: Every day | ORAL | 3 refills | Status: DC
  Filled 2021-11-14: qty 30, 30d supply, fill #0
  Filled 2021-12-15: qty 30, 30d supply, fill #1

## 2021-11-17 ENCOUNTER — Other Ambulatory Visit (HOSPITAL_BASED_OUTPATIENT_CLINIC_OR_DEPARTMENT_OTHER): Payer: Self-pay

## 2021-11-18 ENCOUNTER — Other Ambulatory Visit (HOSPITAL_BASED_OUTPATIENT_CLINIC_OR_DEPARTMENT_OTHER): Payer: Self-pay

## 2021-12-16 ENCOUNTER — Other Ambulatory Visit (HOSPITAL_BASED_OUTPATIENT_CLINIC_OR_DEPARTMENT_OTHER): Payer: Self-pay

## 2021-12-31 ENCOUNTER — Other Ambulatory Visit (HOSPITAL_BASED_OUTPATIENT_CLINIC_OR_DEPARTMENT_OTHER): Payer: Self-pay

## 2021-12-31 DIAGNOSIS — S134XXA Sprain of ligaments of cervical spine, initial encounter: Secondary | ICD-10-CM | POA: Diagnosis not present

## 2021-12-31 DIAGNOSIS — I959 Hypotension, unspecified: Secondary | ICD-10-CM | POA: Diagnosis not present

## 2021-12-31 DIAGNOSIS — M542 Cervicalgia: Secondary | ICD-10-CM | POA: Diagnosis not present

## 2021-12-31 DIAGNOSIS — M5451 Vertebrogenic low back pain: Secondary | ICD-10-CM | POA: Diagnosis not present

## 2021-12-31 MED ORDER — TELMISARTAN-HCTZ 40-12.5 MG PO TABS
1.0000 | ORAL_TABLET | Freq: Every day | ORAL | 3 refills | Status: AC
  Filled 2021-12-31: qty 30, 30d supply, fill #0
  Filled 2022-02-12: qty 30, 30d supply, fill #1
  Filled 2022-03-14 – 2022-05-14 (×3): qty 30, 30d supply, fill #2
  Filled 2022-07-03: qty 30, 30d supply, fill #3
  Filled 2022-08-30: qty 30, 30d supply, fill #4
  Filled 2022-09-29 – 2022-10-06 (×2): qty 30, 30d supply, fill #5
  Filled 2022-11-06: qty 30, 30d supply, fill #6

## 2022-01-05 ENCOUNTER — Other Ambulatory Visit (HOSPITAL_BASED_OUTPATIENT_CLINIC_OR_DEPARTMENT_OTHER): Payer: Self-pay

## 2022-01-08 DIAGNOSIS — F411 Generalized anxiety disorder: Secondary | ICD-10-CM | POA: Diagnosis not present

## 2022-01-08 DIAGNOSIS — F3341 Major depressive disorder, recurrent, in partial remission: Secondary | ICD-10-CM | POA: Diagnosis not present

## 2022-01-09 ENCOUNTER — Other Ambulatory Visit: Payer: BC Managed Care – PPO

## 2022-01-14 ENCOUNTER — Other Ambulatory Visit (HOSPITAL_BASED_OUTPATIENT_CLINIC_OR_DEPARTMENT_OTHER): Payer: Self-pay

## 2022-01-28 ENCOUNTER — Other Ambulatory Visit: Payer: Self-pay | Admitting: Family Medicine

## 2022-01-28 ENCOUNTER — Ambulatory Visit: Payer: BC Managed Care – PPO

## 2022-01-28 ENCOUNTER — Ambulatory Visit
Admission: RE | Admit: 2022-01-28 | Discharge: 2022-01-28 | Disposition: A | Discharge status: Home / Self Care | Payer: BC Managed Care – PPO | Source: Ambulatory Visit | Attending: Family Medicine | Admitting: Family Medicine

## 2022-01-28 DIAGNOSIS — R92322 Mammographic fibroglandular density, left breast: Secondary | ICD-10-CM | POA: Diagnosis not present

## 2022-01-28 DIAGNOSIS — N6489 Other specified disorders of breast: Secondary | ICD-10-CM

## 2022-02-12 ENCOUNTER — Other Ambulatory Visit: Payer: Self-pay

## 2022-02-12 ENCOUNTER — Other Ambulatory Visit (HOSPITAL_BASED_OUTPATIENT_CLINIC_OR_DEPARTMENT_OTHER): Payer: Self-pay

## 2022-02-13 ENCOUNTER — Other Ambulatory Visit (HOSPITAL_BASED_OUTPATIENT_CLINIC_OR_DEPARTMENT_OTHER): Payer: Self-pay

## 2022-03-14 ENCOUNTER — Other Ambulatory Visit (HOSPITAL_BASED_OUTPATIENT_CLINIC_OR_DEPARTMENT_OTHER): Payer: Self-pay

## 2022-03-16 ENCOUNTER — Other Ambulatory Visit: Payer: Self-pay

## 2022-03-17 ENCOUNTER — Other Ambulatory Visit (HOSPITAL_BASED_OUTPATIENT_CLINIC_OR_DEPARTMENT_OTHER): Payer: Self-pay

## 2022-04-15 DIAGNOSIS — F3342 Major depressive disorder, recurrent, in full remission: Secondary | ICD-10-CM | POA: Diagnosis not present

## 2022-05-01 DIAGNOSIS — S46811A Strain of other muscles, fascia and tendons at shoulder and upper arm level, right arm, initial encounter: Secondary | ICD-10-CM | POA: Diagnosis not present

## 2022-05-01 DIAGNOSIS — S46812A Strain of other muscles, fascia and tendons at shoulder and upper arm level, left arm, initial encounter: Secondary | ICD-10-CM | POA: Diagnosis not present

## 2022-05-01 DIAGNOSIS — M542 Cervicalgia: Secondary | ICD-10-CM | POA: Diagnosis not present

## 2022-05-04 DIAGNOSIS — M5451 Vertebrogenic low back pain: Secondary | ICD-10-CM | POA: Diagnosis not present

## 2022-05-04 DIAGNOSIS — M542 Cervicalgia: Secondary | ICD-10-CM | POA: Diagnosis not present

## 2022-05-04 DIAGNOSIS — R296 Repeated falls: Secondary | ICD-10-CM | POA: Diagnosis not present

## 2022-05-14 ENCOUNTER — Other Ambulatory Visit (HOSPITAL_BASED_OUTPATIENT_CLINIC_OR_DEPARTMENT_OTHER): Payer: Self-pay

## 2022-05-15 DIAGNOSIS — R296 Repeated falls: Secondary | ICD-10-CM | POA: Diagnosis not present

## 2022-05-15 DIAGNOSIS — M546 Pain in thoracic spine: Secondary | ICD-10-CM | POA: Diagnosis not present

## 2022-05-15 DIAGNOSIS — M542 Cervicalgia: Secondary | ICD-10-CM | POA: Diagnosis not present

## 2022-05-15 DIAGNOSIS — R2681 Unsteadiness on feet: Secondary | ICD-10-CM | POA: Diagnosis not present

## 2022-05-21 ENCOUNTER — Encounter: Payer: Self-pay | Admitting: Neurology

## 2022-05-21 ENCOUNTER — Ambulatory Visit: Payer: BC Managed Care – PPO | Admitting: Neurology

## 2022-05-21 ENCOUNTER — Other Ambulatory Visit (HOSPITAL_BASED_OUTPATIENT_CLINIC_OR_DEPARTMENT_OTHER): Payer: Self-pay

## 2022-05-21 VITALS — BP 126/80 | HR 80 | Ht 66.0 in | Wt 271.0 lb

## 2022-05-21 DIAGNOSIS — G43709 Chronic migraine without aura, not intractable, without status migrainosus: Secondary | ICD-10-CM | POA: Diagnosis not present

## 2022-05-21 DIAGNOSIS — H4710 Unspecified papilledema: Secondary | ICD-10-CM

## 2022-05-21 MED ORDER — TOPIRAMATE 100 MG PO TABS: 200.0000 mg | ORAL_TABLET | Freq: Every day | ORAL | 4 refills | Status: DC

## 2022-05-21 MED ORDER — AJOVY 225 MG/1.5ML ~~LOC~~ SOAJ: 225.0000 mg | SUBCUTANEOUS | 11 refills | Status: DC

## 2022-05-21 MED ORDER — ELETRIPTAN HYDROBROMIDE 40 MG PO TABS: ORAL_TABLET | ORAL | 11 refills | Status: DC

## 2022-05-21 NOTE — Patient Instructions (Signed)
Continue medications Please see your eye doctor, have them send the notes, let me know if anything changes Continue to work on weight loss  Let me know about increase in headache, vision change See you in 1 year
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
## 2022-06-01 IMAGING — MG MM DIGITAL SCREENING BILAT W/ TOMO AND CAD
8 of 14 series · 8 of 40 positions shown · non-contrast
Comparison: None Available.
COMPARISON: None Available.

Addendum:
CLINICAL DATA: Screening.

EXAM:
DIGITAL SCREENING BILATERAL MAMMOGRAM WITH TOMOSYNTHESIS AND CAD
TECHNIQUE: Bilateral screening digital craniocaudal and mediolateral oblique
mammograms were obtained. Bilateral screening digital breast
tomosynthesis was performed. The images were evaluated with
computer-aided detection.

[R MLO synth-2D (1 of 2)]
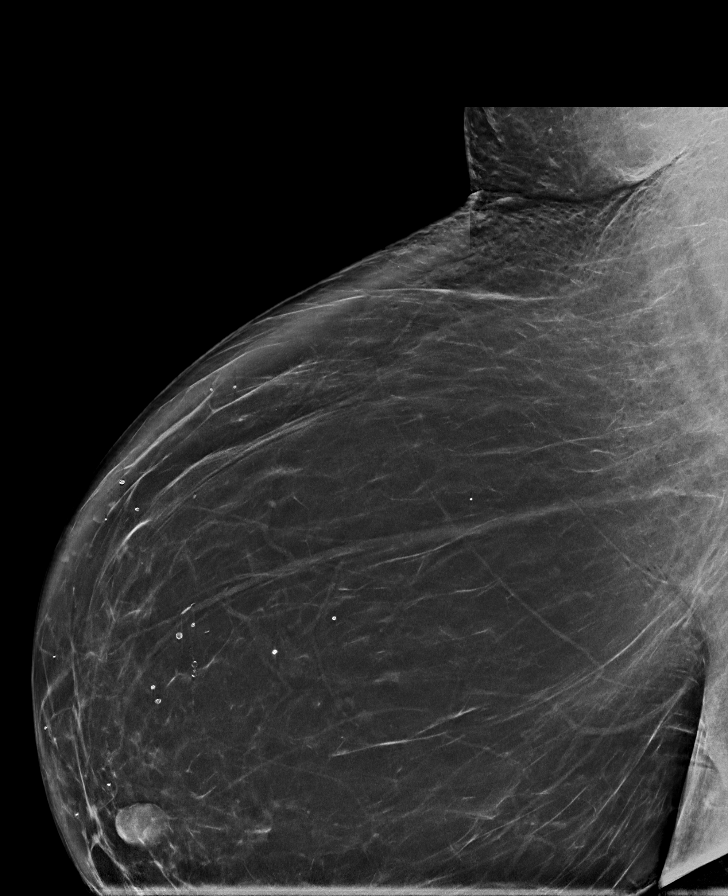

[R MLO synth-2D (2 of 2)]
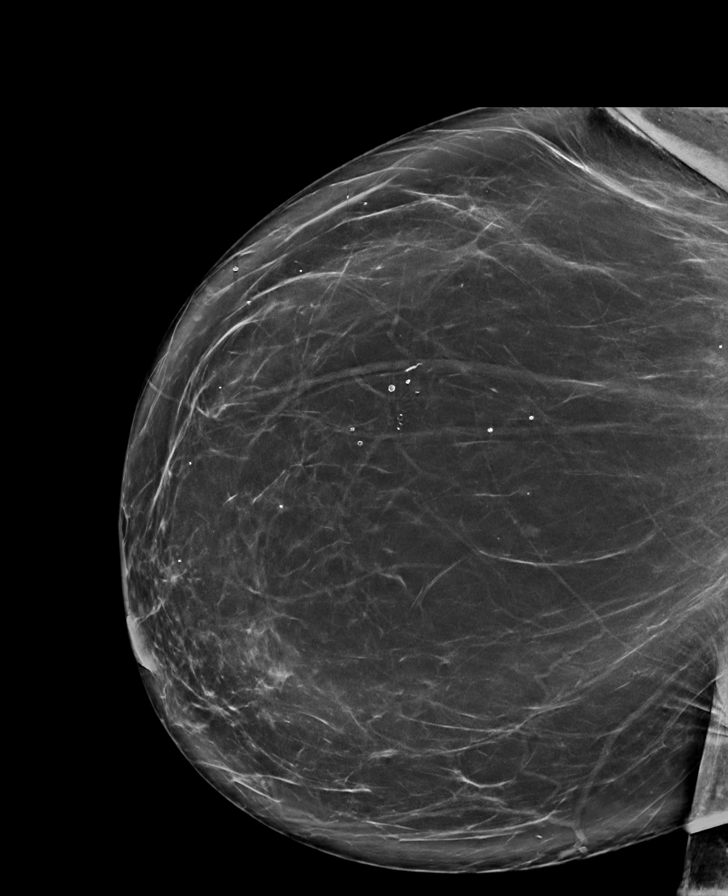

[L MLO synth-2D (1 of 2)]
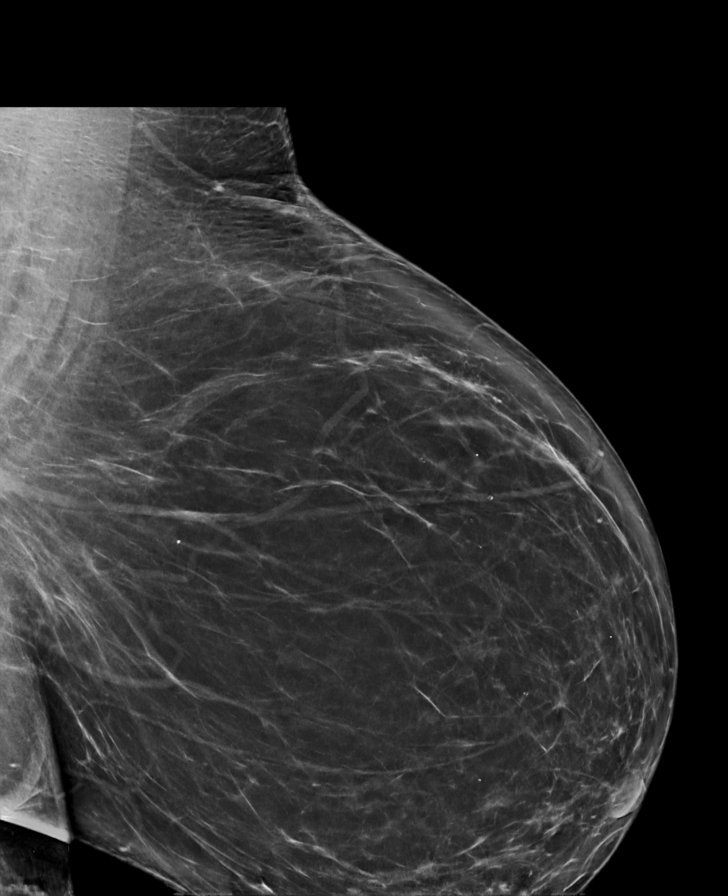

[L CC synth-2D]
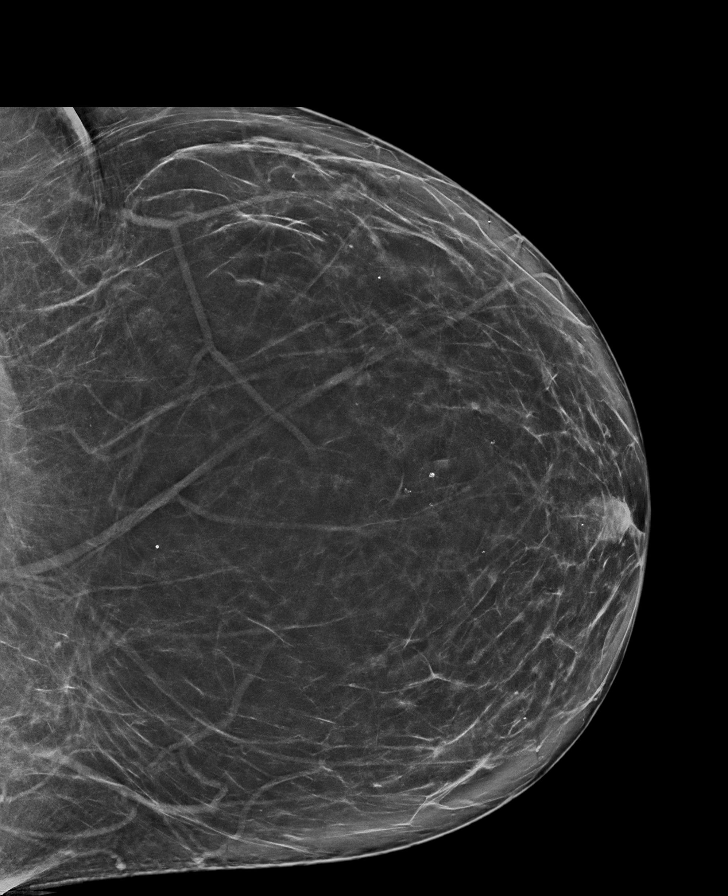

[L MLO synth-2D (2 of 2)]
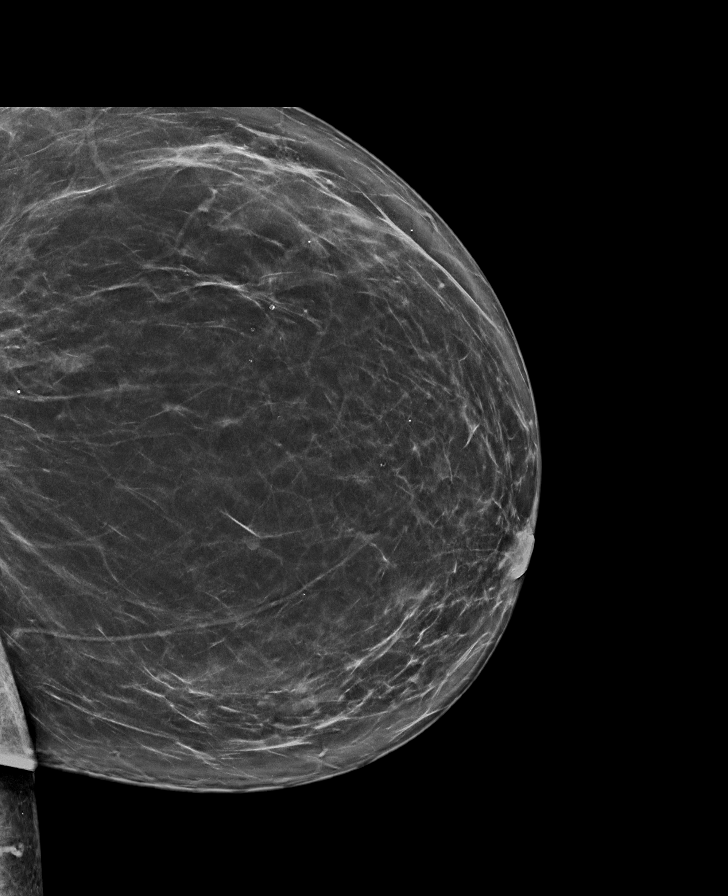

[R CC synth-2D]
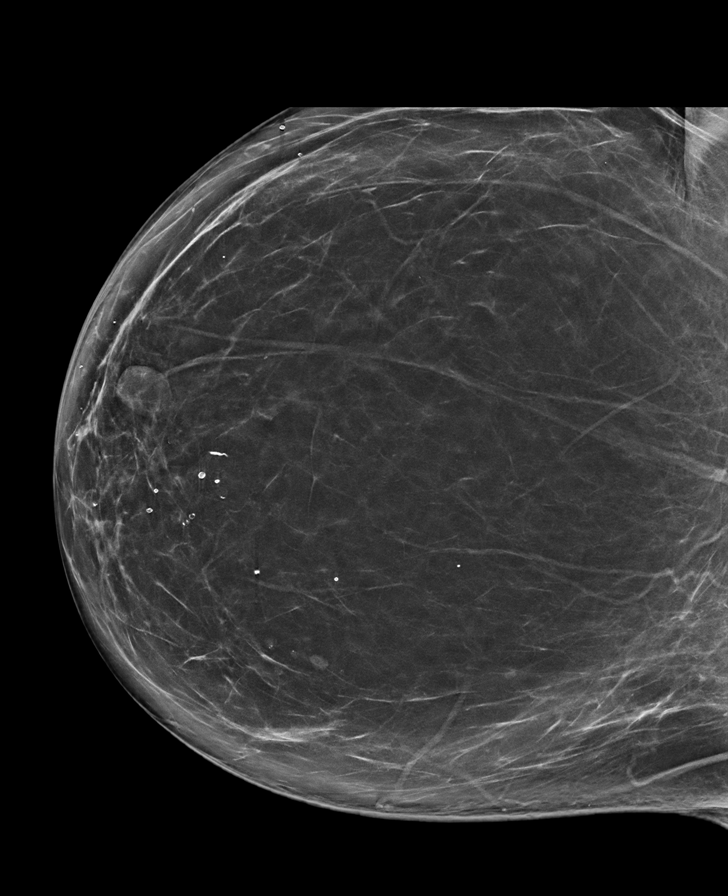

[L CV synth-2D]
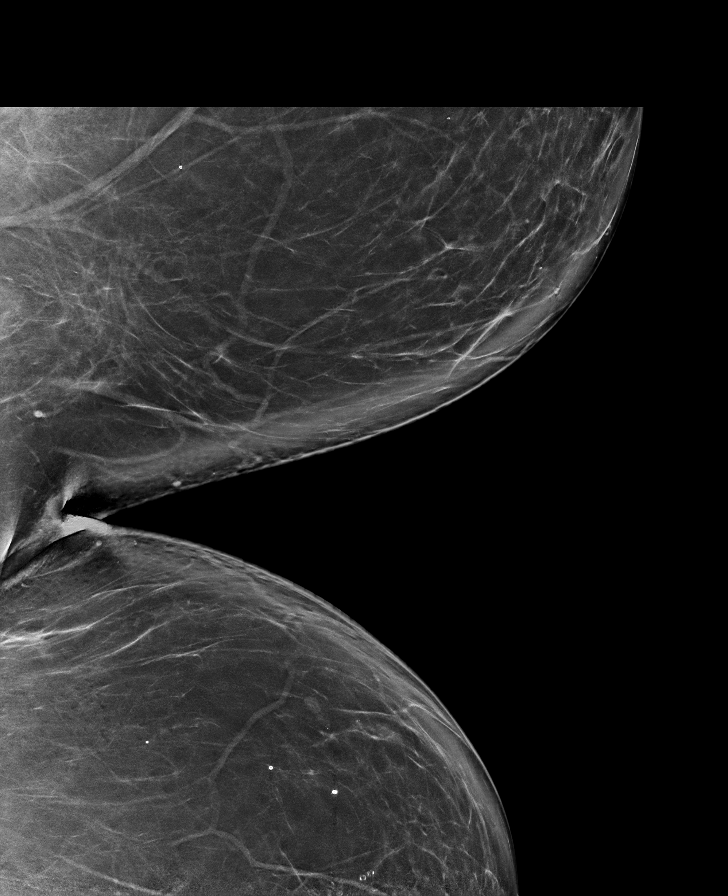

[L MLO tomo · tomo slice 51/100.0]
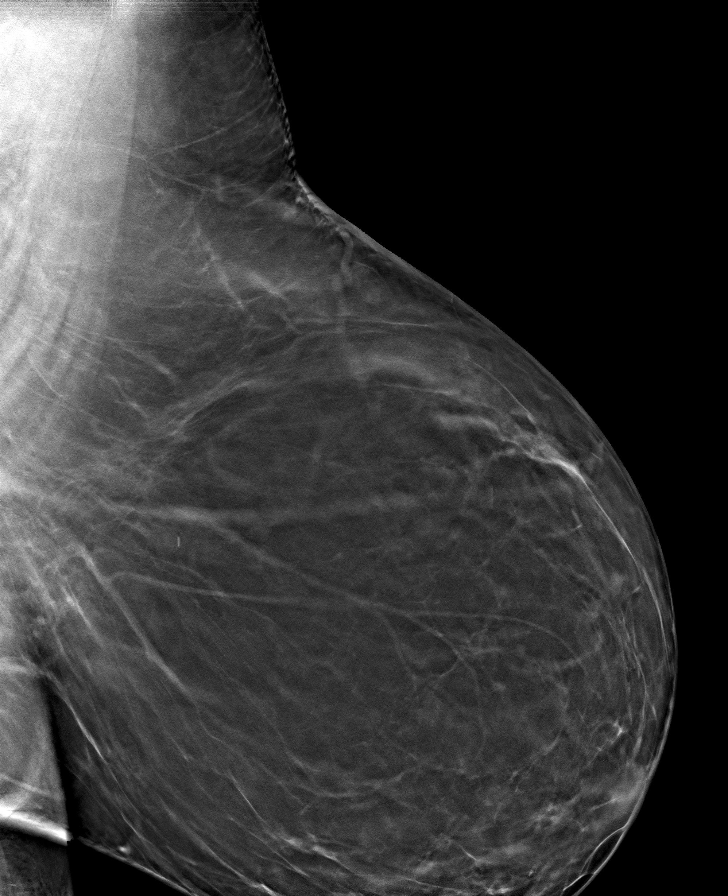

[8 of 40 positions shown; findings below may reference images not displayed]

ACR Breast Density Category b: There are scattered areas of
fibroglandular density.
FINDINGS: In the left breast, a possible asymmetry warrants further
evaluation. In the right breast, no findings suspicious for
malignancy.
IMPRESSION: Further evaluation is suggested for possible asymmetry in the left
breast.

RECOMMENDATION:
Diagnostic mammogram and possibly ultrasound of the left breast.
(Code:C5-Z-FF2)

The patient will be contacted regarding the findings, and additional
imaging will be scheduled.

BI-RADS CATEGORY  0: Incomplete. Need additional imaging evaluation
and/or prior mammograms for comparison.

ADDENDUM:
Comparison is now made with prior studies performed 04/26/2019 and
01/27/2014. After comparison, the recommendation stands for further
evaluation of the LEFT breast asymmetry.

BI-RADS category 0: Incomplete. Need additional imaging evaluation
and/or prior mammograms for comparison.

*** End of Addendum ***
ACR Breast Density Category b: There are scattered areas of
fibroglandular density.
FINDINGS: In the left breast, a possible asymmetry warrants further
evaluation. In the right breast, no findings suspicious for
malignancy.
IMPRESSION: Further evaluation is suggested for possible asymmetry in the left
breast.

RECOMMENDATION:
Diagnostic mammogram and possibly ultrasound of the left breast.
(Code:C5-Z-FF2)

The patient will be contacted regarding the findings, and additional
imaging will be scheduled.

BI-RADS CATEGORY  0: Incomplete. Need additional imaging evaluation
and/or prior mammograms for comparison.

## 2022-06-05 DIAGNOSIS — M542 Cervicalgia: Secondary | ICD-10-CM | POA: Diagnosis not present

## 2022-06-09 ENCOUNTER — Other Ambulatory Visit (HOSPITAL_BASED_OUTPATIENT_CLINIC_OR_DEPARTMENT_OTHER): Payer: Self-pay

## 2022-06-09 DIAGNOSIS — Z0000 Encounter for general adult medical examination without abnormal findings: Secondary | ICD-10-CM | POA: Diagnosis not present

## 2022-06-09 DIAGNOSIS — E559 Vitamin D deficiency, unspecified: Secondary | ICD-10-CM | POA: Diagnosis not present

## 2022-06-09 DIAGNOSIS — Z79899 Other long term (current) drug therapy: Secondary | ICD-10-CM | POA: Diagnosis not present

## 2022-06-09 DIAGNOSIS — E119 Type 2 diabetes mellitus without complications: Secondary | ICD-10-CM | POA: Diagnosis not present

## 2022-06-09 MED ORDER — MOUNJARO 10 MG/0.5ML ~~LOC~~ SOAJ
10.0000 mg | SUBCUTANEOUS | 1 refills | Status: DC
  Filled 2022-06-09: qty 2, 28d supply, fill #0
  Filled 2022-07-03: qty 2, 28d supply, fill #1

## 2022-06-10 ENCOUNTER — Other Ambulatory Visit (HOSPITAL_BASED_OUTPATIENT_CLINIC_OR_DEPARTMENT_OTHER): Payer: Self-pay

## 2022-06-22 DIAGNOSIS — M542 Cervicalgia: Secondary | ICD-10-CM | POA: Diagnosis not present

## 2022-06-26 DIAGNOSIS — H47333 Pseudopapilledema of optic disc, bilateral: Secondary | ICD-10-CM | POA: Diagnosis not present

## 2022-06-29 DIAGNOSIS — M542 Cervicalgia: Secondary | ICD-10-CM | POA: Diagnosis not present

## 2022-07-02 ENCOUNTER — Other Ambulatory Visit (HOSPITAL_BASED_OUTPATIENT_CLINIC_OR_DEPARTMENT_OTHER): Payer: Self-pay

## 2022-07-02 MED ORDER — TRIAMTERENE-HCTZ 37.5-25 MG PO TABS
ORAL_TABLET | ORAL | 0 refills | Status: DC
  Filled 2022-07-02: qty 15, 30d supply, fill #0
  Filled 2022-08-30: qty 15, 30d supply, fill #1
  Filled 2022-09-29 – 2022-10-06 (×2): qty 15, 30d supply, fill #2

## 2022-07-03 ENCOUNTER — Other Ambulatory Visit (HOSPITAL_BASED_OUTPATIENT_CLINIC_OR_DEPARTMENT_OTHER): Payer: Self-pay

## 2022-07-06 DIAGNOSIS — M542 Cervicalgia: Secondary | ICD-10-CM | POA: Diagnosis not present

## 2022-07-13 DIAGNOSIS — M542 Cervicalgia: Secondary | ICD-10-CM | POA: Diagnosis not present

## 2022-07-23 DIAGNOSIS — M542 Cervicalgia: Secondary | ICD-10-CM | POA: Diagnosis not present

## 2022-07-30 ENCOUNTER — Other Ambulatory Visit (HOSPITAL_BASED_OUTPATIENT_CLINIC_OR_DEPARTMENT_OTHER): Payer: Self-pay

## 2022-07-30 MED ORDER — MOUNJARO 12.5 MG/0.5ML ~~LOC~~ SOAJ
12.5000 mg | SUBCUTANEOUS | 0 refills | Status: DC
  Filled 2022-07-30: qty 2, 28d supply, fill #0

## 2022-08-03 ENCOUNTER — Other Ambulatory Visit: Payer: Self-pay

## 2022-08-06 ENCOUNTER — Ambulatory Visit
Admission: RE | Admit: 2022-08-06 | Discharge: 2022-08-06 | Disposition: A | Discharge status: Home / Self Care | Payer: BC Managed Care – PPO | Source: Ambulatory Visit | Attending: Family Medicine | Admitting: Family Medicine

## 2022-08-06 ENCOUNTER — Other Ambulatory Visit (HOSPITAL_BASED_OUTPATIENT_CLINIC_OR_DEPARTMENT_OTHER): Payer: Self-pay

## 2022-08-06 DIAGNOSIS — R928 Other abnormal and inconclusive findings on diagnostic imaging of breast: Secondary | ICD-10-CM | POA: Diagnosis not present

## 2022-08-06 DIAGNOSIS — N6489 Other specified disorders of breast: Secondary | ICD-10-CM

## 2022-08-30 ENCOUNTER — Other Ambulatory Visit (HOSPITAL_BASED_OUTPATIENT_CLINIC_OR_DEPARTMENT_OTHER): Payer: Self-pay

## 2022-08-31 ENCOUNTER — Other Ambulatory Visit (HOSPITAL_BASED_OUTPATIENT_CLINIC_OR_DEPARTMENT_OTHER): Payer: Self-pay

## 2022-08-31 ENCOUNTER — Other Ambulatory Visit: Payer: Self-pay

## 2022-08-31 MED ORDER — MOUNJARO 15 MG/0.5ML ~~LOC~~ SOAJ
15.0000 mg | SUBCUTANEOUS | 3 refills | Status: DC
  Filled 2022-08-31: qty 2, 28d supply, fill #0
  Filled 2022-09-29: qty 2, 28d supply, fill #1
  Filled 2022-11-06: qty 2, 28d supply, fill #2
  Filled 2023-04-08: qty 2, 28d supply, fill #3

## 2022-09-29 ENCOUNTER — Other Ambulatory Visit: Payer: Self-pay

## 2022-09-29 ENCOUNTER — Other Ambulatory Visit (HOSPITAL_BASED_OUTPATIENT_CLINIC_OR_DEPARTMENT_OTHER): Payer: Self-pay

## 2022-10-01 ENCOUNTER — Other Ambulatory Visit: Payer: Self-pay

## 2022-10-01 ENCOUNTER — Other Ambulatory Visit (HOSPITAL_COMMUNITY): Payer: Self-pay

## 2022-10-02 ENCOUNTER — Other Ambulatory Visit (HOSPITAL_COMMUNITY): Payer: Self-pay

## 2022-10-02 ENCOUNTER — Telehealth: Payer: Self-pay

## 2022-10-02 DIAGNOSIS — Z1211 Encounter for screening for malignant neoplasm of colon: Secondary | ICD-10-CM | POA: Diagnosis not present

## 2022-10-02 DIAGNOSIS — D123 Benign neoplasm of transverse colon: Secondary | ICD-10-CM | POA: Diagnosis not present

## 2022-10-02 NOTE — Telephone Encounter (Signed)
Pharmacy Patient Advocate Encounter   Received notification from CoverMyMeds that prior authorization for AJOVY (fremanezumab-vfrm) injection 225MG /1.5ML auto-injectors is required/requested.   Insurance verification completed.   The patient is insured through [facility] .   Per test claim: PA required; PA submitted to [facility] via CoverMyMeds Key/confirmation #/EOC [id number] Status is pending

## 2022-10-05 ENCOUNTER — Other Ambulatory Visit (HOSPITAL_COMMUNITY): Payer: Self-pay

## 2022-10-05 NOTE — Telephone Encounter (Signed)
Pharmacy Patient Advocate Encounter  Received notification from [facility] that Prior Authorization for AJOVY (fremanezumab-vfrm) injection 225MG /1.5ML auto-injectors has been Your PA has been resolved, no additional PA is required.   PA #/Case ID/Reference #: N/A

## 2022-10-06 ENCOUNTER — Other Ambulatory Visit (HOSPITAL_BASED_OUTPATIENT_CLINIC_OR_DEPARTMENT_OTHER): Payer: Self-pay

## 2022-10-07 ENCOUNTER — Other Ambulatory Visit (HOSPITAL_BASED_OUTPATIENT_CLINIC_OR_DEPARTMENT_OTHER): Payer: Self-pay

## 2022-10-07 DIAGNOSIS — F3342 Major depressive disorder, recurrent, in full remission: Secondary | ICD-10-CM | POA: Diagnosis not present

## 2022-11-06 ENCOUNTER — Other Ambulatory Visit (HOSPITAL_BASED_OUTPATIENT_CLINIC_OR_DEPARTMENT_OTHER): Payer: Self-pay

## 2022-11-07 ENCOUNTER — Other Ambulatory Visit (HOSPITAL_BASED_OUTPATIENT_CLINIC_OR_DEPARTMENT_OTHER): Payer: Self-pay

## 2022-11-07 MED ORDER — TRIAMTERENE-HCTZ 37.5-25 MG PO TABS
0.5000 | ORAL_TABLET | Freq: Every morning | ORAL | 0 refills | Status: DC
  Filled 2022-11-07: qty 15, 30d supply, fill #0
  Filled 2023-01-11: qty 15, 30d supply, fill #1
  Filled 2023-03-15: qty 15, 30d supply, fill #2

## 2022-11-12 ENCOUNTER — Other Ambulatory Visit (HOSPITAL_BASED_OUTPATIENT_CLINIC_OR_DEPARTMENT_OTHER): Payer: Self-pay

## 2022-12-11 ENCOUNTER — Other Ambulatory Visit (HOSPITAL_BASED_OUTPATIENT_CLINIC_OR_DEPARTMENT_OTHER): Payer: Self-pay

## 2022-12-11 DIAGNOSIS — I10 Essential (primary) hypertension: Secondary | ICD-10-CM | POA: Diagnosis not present

## 2022-12-11 DIAGNOSIS — E119 Type 2 diabetes mellitus without complications: Secondary | ICD-10-CM | POA: Diagnosis not present

## 2022-12-11 DIAGNOSIS — Z862 Personal history of diseases of the blood and blood-forming organs and certain disorders involving the immune mechanism: Secondary | ICD-10-CM | POA: Diagnosis not present

## 2022-12-11 MED ORDER — MOUNJARO 15 MG/0.5ML ~~LOC~~ SOAJ
15.0000 mg | SUBCUTANEOUS | 3 refills | Status: DC
  Filled 2022-12-11: qty 2, 28d supply, fill #0
  Filled 2023-01-11: qty 2, 28d supply, fill #1
  Filled 2023-02-09: qty 2, 28d supply, fill #2
  Filled 2023-03-15: qty 2, 28d supply, fill #3
  Filled 2023-05-06: qty 2, 28d supply, fill #4
  Filled 2023-06-03: qty 2, 28d supply, fill #5
  Filled 2023-07-03: qty 2, 28d supply, fill #6
  Filled 2023-08-05: qty 2, 28d supply, fill #7
  Filled 2023-08-30: qty 2, 28d supply, fill #8
  Filled 2023-09-29: qty 2, 28d supply, fill #9
  Filled 2023-10-27: qty 2, 28d supply, fill #10
  Filled 2023-11-23: qty 2, 28d supply, fill #11

## 2022-12-11 MED ORDER — TELMISARTAN-HCTZ 40-12.5 MG PO TABS
1.0000 | ORAL_TABLET | Freq: Every day | ORAL | 3 refills | Status: DC
  Filled 2022-12-11: qty 30, 30d supply, fill #0
  Filled 2023-02-08: qty 30, 30d supply, fill #1

## 2022-12-12 ENCOUNTER — Other Ambulatory Visit (HOSPITAL_BASED_OUTPATIENT_CLINIC_OR_DEPARTMENT_OTHER): Payer: Self-pay

## 2022-12-17 DIAGNOSIS — M5451 Vertebrogenic low back pain: Secondary | ICD-10-CM | POA: Diagnosis not present

## 2022-12-17 DIAGNOSIS — M542 Cervicalgia: Secondary | ICD-10-CM | POA: Diagnosis not present

## 2022-12-23 ENCOUNTER — Other Ambulatory Visit (HOSPITAL_BASED_OUTPATIENT_CLINIC_OR_DEPARTMENT_OTHER): Payer: Self-pay

## 2022-12-24 ENCOUNTER — Other Ambulatory Visit (HOSPITAL_BASED_OUTPATIENT_CLINIC_OR_DEPARTMENT_OTHER): Payer: Self-pay

## 2023-01-09 DIAGNOSIS — M542 Cervicalgia: Secondary | ICD-10-CM | POA: Diagnosis not present

## 2023-01-09 DIAGNOSIS — M5451 Vertebrogenic low back pain: Secondary | ICD-10-CM | POA: Diagnosis not present

## 2023-01-11 ENCOUNTER — Other Ambulatory Visit (HOSPITAL_BASED_OUTPATIENT_CLINIC_OR_DEPARTMENT_OTHER): Payer: Self-pay

## 2023-01-12 ENCOUNTER — Other Ambulatory Visit: Payer: Self-pay

## 2023-01-15 DIAGNOSIS — M542 Cervicalgia: Secondary | ICD-10-CM | POA: Diagnosis not present

## 2023-01-29 DIAGNOSIS — R52 Pain, unspecified: Secondary | ICD-10-CM | POA: Diagnosis not present

## 2023-01-29 DIAGNOSIS — R051 Acute cough: Secondary | ICD-10-CM | POA: Diagnosis not present

## 2023-01-29 DIAGNOSIS — J029 Acute pharyngitis, unspecified: Secondary | ICD-10-CM | POA: Diagnosis not present

## 2023-01-29 DIAGNOSIS — R5383 Other fatigue: Secondary | ICD-10-CM | POA: Diagnosis not present

## 2023-01-29 DIAGNOSIS — Z03818 Encounter for observation for suspected exposure to other biological agents ruled out: Secondary | ICD-10-CM | POA: Diagnosis not present

## 2023-02-02 ENCOUNTER — Other Ambulatory Visit (HOSPITAL_BASED_OUTPATIENT_CLINIC_OR_DEPARTMENT_OTHER): Payer: Self-pay

## 2023-02-02 DIAGNOSIS — J029 Acute pharyngitis, unspecified: Secondary | ICD-10-CM | POA: Diagnosis not present

## 2023-02-02 DIAGNOSIS — J988 Other specified respiratory disorders: Secondary | ICD-10-CM | POA: Diagnosis not present

## 2023-02-02 MED ORDER — AMOXICILLIN-POT CLAVULANATE 875-125 MG PO TABS
1.0000 | ORAL_TABLET | Freq: Two times a day (BID) | ORAL | 0 refills | Status: DC
  Filled 2023-02-02: qty 20, 10d supply, fill #0

## 2023-02-13 ENCOUNTER — Other Ambulatory Visit (HOSPITAL_BASED_OUTPATIENT_CLINIC_OR_DEPARTMENT_OTHER): Payer: Self-pay

## 2023-03-15 ENCOUNTER — Other Ambulatory Visit (HOSPITAL_BASED_OUTPATIENT_CLINIC_OR_DEPARTMENT_OTHER): Payer: Self-pay

## 2023-04-02 DIAGNOSIS — F331 Major depressive disorder, recurrent, moderate: Secondary | ICD-10-CM | POA: Diagnosis not present

## 2023-04-07 DIAGNOSIS — D649 Anemia, unspecified: Secondary | ICD-10-CM | POA: Diagnosis not present

## 2023-04-07 DIAGNOSIS — D573 Sickle-cell trait: Secondary | ICD-10-CM | POA: Diagnosis not present

## 2023-04-07 DIAGNOSIS — R5383 Other fatigue: Secondary | ICD-10-CM | POA: Diagnosis not present

## 2023-04-07 DIAGNOSIS — E119 Type 2 diabetes mellitus without complications: Secondary | ICD-10-CM | POA: Diagnosis not present

## 2023-04-07 DIAGNOSIS — Z79899 Other long term (current) drug therapy: Secondary | ICD-10-CM | POA: Diagnosis not present

## 2023-04-08 ENCOUNTER — Other Ambulatory Visit (HOSPITAL_BASED_OUTPATIENT_CLINIC_OR_DEPARTMENT_OTHER): Payer: Self-pay

## 2023-04-19 DIAGNOSIS — F331 Major depressive disorder, recurrent, moderate: Secondary | ICD-10-CM | POA: Diagnosis not present

## 2023-04-20 DIAGNOSIS — M542 Cervicalgia: Secondary | ICD-10-CM | POA: Diagnosis not present

## 2023-04-20 DIAGNOSIS — M546 Pain in thoracic spine: Secondary | ICD-10-CM | POA: Diagnosis not present

## 2023-04-20 DIAGNOSIS — M5451 Vertebrogenic low back pain: Secondary | ICD-10-CM | POA: Diagnosis not present

## 2023-05-03 DIAGNOSIS — F3341 Major depressive disorder, recurrent, in partial remission: Secondary | ICD-10-CM | POA: Diagnosis not present

## 2023-05-06 ENCOUNTER — Other Ambulatory Visit: Payer: Self-pay

## 2023-05-06 ENCOUNTER — Other Ambulatory Visit (HOSPITAL_BASED_OUTPATIENT_CLINIC_OR_DEPARTMENT_OTHER): Payer: Self-pay

## 2023-05-08 ENCOUNTER — Other Ambulatory Visit (HOSPITAL_BASED_OUTPATIENT_CLINIC_OR_DEPARTMENT_OTHER): Payer: Self-pay

## 2023-05-08 ENCOUNTER — Encounter (HOSPITAL_BASED_OUTPATIENT_CLINIC_OR_DEPARTMENT_OTHER): Payer: Self-pay
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
## 2023-05-19 ENCOUNTER — Encounter: Payer: Self-pay | Admitting: Neurology

## 2023-05-19 ENCOUNTER — Ambulatory Visit: Payer: BC Managed Care – PPO | Admitting: Neurology

## 2023-05-19 VITALS — BP 138/87 | HR 77 | Ht 66.0 in | Wt 253.0 lb

## 2023-05-19 DIAGNOSIS — F411 Generalized anxiety disorder: Secondary | ICD-10-CM | POA: Diagnosis not present

## 2023-05-19 DIAGNOSIS — G43709 Chronic migraine without aura, not intractable, without status migrainosus: Secondary | ICD-10-CM | POA: Diagnosis not present

## 2023-05-19 MED ORDER — AJOVY 225 MG/1.5ML ~~LOC~~ SOAJ: 225.0000 mg | SUBCUTANEOUS | 11 refills | Status: AC

## 2023-05-19 MED ORDER — ONDANSETRON HCL 4 MG PO TABS: ORAL_TABLET | ORAL | 1 refills | Status: AC

## 2023-05-19 MED ORDER — TOPIRAMATE 100 MG PO TABS: 200.0000 mg | ORAL_TABLET | Freq: Every day | ORAL | 4 refills | Status: AC

## 2023-05-19 MED ORDER — ELETRIPTAN HYDROBROMIDE 40 MG PO TABS: ORAL_TABLET | ORAL | 11 refills | Status: AC

## 2023-05-19 NOTE — Patient Instructions (Signed)
 Continue current medications. Follow up with your eye doctor, send the records to me or let me know and I can request them. Great job with weight loss!! Keep up the good work!!

## 2023-05-24 DIAGNOSIS — F3341 Major depressive disorder, recurrent, in partial remission: Secondary | ICD-10-CM | POA: Diagnosis not present

## 2023-06-03 ENCOUNTER — Other Ambulatory Visit (HOSPITAL_BASED_OUTPATIENT_CLINIC_OR_DEPARTMENT_OTHER): Payer: Self-pay

## 2023-06-04 DIAGNOSIS — H47333 Pseudopapilledema of optic disc, bilateral: Secondary | ICD-10-CM | POA: Diagnosis not present

## 2023-06-10 DIAGNOSIS — E559 Vitamin D deficiency, unspecified: Secondary | ICD-10-CM | POA: Diagnosis not present

## 2023-06-10 DIAGNOSIS — E119 Type 2 diabetes mellitus without complications: Secondary | ICD-10-CM | POA: Diagnosis not present

## 2023-06-10 DIAGNOSIS — D649 Anemia, unspecified: Secondary | ICD-10-CM | POA: Diagnosis not present

## 2023-06-10 DIAGNOSIS — Z0000 Encounter for general adult medical examination without abnormal findings: Secondary | ICD-10-CM | POA: Diagnosis not present

## 2023-06-10 DIAGNOSIS — Z862 Personal history of diseases of the blood and blood-forming organs and certain disorders involving the immune mechanism: Secondary | ICD-10-CM | POA: Diagnosis not present

## 2023-06-15 ENCOUNTER — Other Ambulatory Visit (HOSPITAL_BASED_OUTPATIENT_CLINIC_OR_DEPARTMENT_OTHER): Payer: Self-pay

## 2023-06-15 MED ORDER — DEXCOM G7 SENSOR MISC
1.0000 | 11 refills | Status: AC
  Filled 2023-06-15: qty 3, 30d supply, fill #0
  Filled 2023-07-03 – 2023-07-08 (×3): qty 3, 30d supply, fill #1

## 2023-06-15 MED ORDER — FREESTYLE LIBRE 3 PLUS SENSOR MISC
11 refills | Status: DC
  Filled 2023-06-15: qty 2, 30d supply, fill #0

## 2023-06-16 ENCOUNTER — Other Ambulatory Visit (HOSPITAL_BASED_OUTPATIENT_CLINIC_OR_DEPARTMENT_OTHER): Payer: Self-pay

## 2023-06-28 ENCOUNTER — Other Ambulatory Visit: Payer: Self-pay | Admitting: Family Medicine

## 2023-06-28 DIAGNOSIS — N6489 Other specified disorders of breast: Secondary | ICD-10-CM

## 2023-07-05 ENCOUNTER — Other Ambulatory Visit: Payer: Self-pay

## 2023-07-07 ENCOUNTER — Other Ambulatory Visit (HOSPITAL_BASED_OUTPATIENT_CLINIC_OR_DEPARTMENT_OTHER): Payer: Self-pay

## 2023-07-08 ENCOUNTER — Other Ambulatory Visit: Payer: Self-pay

## 2023-07-09 ENCOUNTER — Other Ambulatory Visit (HOSPITAL_BASED_OUTPATIENT_CLINIC_OR_DEPARTMENT_OTHER): Payer: Self-pay

## 2023-08-07 ENCOUNTER — Emergency Department (HOSPITAL_BASED_OUTPATIENT_CLINIC_OR_DEPARTMENT_OTHER)
Admission: EM | Admit: 2023-08-07 | Discharge: 2023-08-07 | Disposition: A | Discharge status: Home / Self Care | Attending: Emergency Medicine | Admitting: Emergency Medicine

## 2023-08-07 ENCOUNTER — Other Ambulatory Visit: Payer: Self-pay

## 2023-08-07 DIAGNOSIS — I10 Essential (primary) hypertension: Secondary | ICD-10-CM | POA: Diagnosis not present

## 2023-08-07 DIAGNOSIS — M7989 Other specified soft tissue disorders: Secondary | ICD-10-CM | POA: Insufficient documentation

## 2023-08-07 DIAGNOSIS — E119 Type 2 diabetes mellitus without complications: Secondary | ICD-10-CM | POA: Diagnosis not present

## 2023-08-07 DIAGNOSIS — R600 Localized edema: Secondary | ICD-10-CM | POA: Insufficient documentation

## 2023-08-07 DIAGNOSIS — Z79899 Other long term (current) drug therapy: Secondary | ICD-10-CM | POA: Insufficient documentation

## 2023-08-07 DIAGNOSIS — R7989 Other specified abnormal findings of blood chemistry: Secondary | ICD-10-CM | POA: Insufficient documentation

## 2023-08-07 DIAGNOSIS — R2240 Localized swelling, mass and lump, unspecified lower limb: Secondary | ICD-10-CM | POA: Diagnosis not present

## 2023-08-07 LAB — COMPREHENSIVE METABOLIC PANEL WITH GFR
ALT: 22 U/L (ref 0–44)
AST: 29 U/L (ref 15–41)
Albumin: 4.2 g/dL (ref 3.5–5.0)
Alkaline Phosphatase: 54 U/L (ref 38–126)
Anion gap: 14 (ref 5–15)
BUN: 10 mg/dL (ref 6–20)
CO2: 20 mmol/L — ABNORMAL LOW (ref 22–32)
Calcium: 9.4 mg/dL (ref 8.9–10.3)
Chloride: 105 mmol/L (ref 98–111)
Creatinine, Ser: 0.85 mg/dL (ref 0.44–1.00)
GFR, Estimated: >60 mL/min (ref >60)
Glucose, Bld: 70 mg/dL (ref 70–99)
Potassium: 4.1 mmol/L (ref 3.5–5.1)
Sodium: 138 mmol/L (ref 135–145)
Total Bilirubin: 0.5 mg/dL (ref 0.0–1.2)
Total Protein: 7.1 g/dL (ref 6.5–8.1)

## 2023-08-07 LAB — CBC
HCT: 29.6 % — ABNORMAL LOW (ref 36.0–46.0)
Hemoglobin: 10 g/dL — ABNORMAL LOW (ref 12.0–15.0)
MCH: 30.7 pg (ref 26.0–34.0)
MCHC: 33.8 g/dL (ref 30.0–36.0)
MCV: 90.8 fL (ref 80.0–100.0)
Platelets: 276 10*3/uL (ref 150–400)
RBC: 3.26 MIL/uL — ABNORMAL LOW (ref 3.87–5.11)
RDW: 14.9 % (ref 11.5–15.5)
WBC: 5.7 10*3/uL (ref 4.0–10.5)
nRBC: 0 % (ref 0.0–0.2)

## 2023-08-07 LAB — LIPASE, BLOOD: Lipase: 18 U/L (ref 11–51)

## 2023-08-07 LAB — PRO BRAIN NATRIURETIC PEPTIDE: Pro Brain Natriuretic Peptide: 309 pg/mL — ABNORMAL HIGH (ref <300.0)

## 2023-08-07 MED ORDER — FUROSEMIDE 20 MG PO TABS: 20.0000 mg | ORAL_TABLET | Freq: Every day | ORAL | 0 refills | Status: AC | PRN

## 2023-08-07 NOTE — ED Triage Notes (Signed)
 States bilateral leg swelling x 1 week. Hx of same. No hx of CHF. Denies CP or SHOB.

## 2023-08-07 NOTE — Discharge Instructions (Signed)
 You were seen for your leg swelling in the emergency department.   At home, please use compression stocking and the lasix until your leg swelling improves.    Check your MyChart online for the results of any tests that had not resulted by the time you left the emergency department.   Follow-up with your primary doctor in 2-3 days regarding your visit.  Follow-up with cardiology for an echocardiogram and to discuss your symptoms.   Return immediately to the emergency department if you experience any of the following: difficulty breathing, or any other concerning symptoms.    Thank you for visiting our Emergency Department. It was a pleasure taking care of you today.

## 2023-08-07 NOTE — ED Notes (Signed)
 Dc instructions reviewed with patient. Patient voiced understanding. Dc with belongings.

## 2023-08-07 NOTE — ED Provider Notes (Signed)
 [place] EMERGENCY DEPARTMENT AT [facility] Provider Note   CSN: [id number] Arrival date & time: 08/07/23  1406     Patient presents with: Joint Swelling and Leg Swelling   Jessica Fritz is a 48 y.o. female.   48 year old female with a history of diabetes, hypertension, lower extremity edema who presents emergency department with lower extremity swelling.  Patient reports that she has been in [place]  for a trip and returned.  Had not taken any of her medication there.  Says that she noticed that both of her legs have been more swollen than usual.  No weight gain she is aware of.  Denies any shortness of breath, chest pain, orthopnea, or PND.  Was previously on hydrochlorothiazide  but says that she is only on temlisartan now for her blood pressure.  Has been trying compression stockings at home but have not improved her swelling       Prior to Admission medications   Medication Sig Start Date End Date Taking? Authorizing Provider  furosemide (LASIX) 20 MG tablet Take 1 tablet (20 mg total) by mouth daily as needed for edema. 08/07/23  Yes Ninetta Basket, MD  Ascorbic Acid (VITAMIN C) 100 MG tablet Take 100 mg by mouth daily.    [provider]  aspirin-acetaminophen -caffeine (EXCEDRIN MIGRAINE) 250-250-65 MG tablet Take 2 tablets by mouth every 6 (six) hours as needed for headache.    [provider]  calcium carbonate (TUMS - DOSED IN MG ELEMENTAL CALCIUM) 500 MG chewable tablet Chew 2 tablets by mouth daily.      [provider]  Continuous Glucose Sensor (DEXCOM G7 SENSOR) MISC Inject 1 each into the skin every 10 days and use to monitor blood sugar 3 or more times a day. 06/15/23     Continuous Glucose Sensor (FREESTYLE LIBRE 3 PLUS SENSOR) MISC Use as directed to continuously monitor blood sugar. Change every 15 days. 06/15/23   Olin Bertin, MD  cyclobenzaprine  (FLEXERIL ) 10 MG tablet Take 1 tablet (10 mg total) by mouth 2 (two) times  daily as needed for muscle spasms. 09/10/17   Hardie Leyland, NP  eletriptan  (RELPAX ) 40 MG tablet Take 1 tab at onset of migraine.  May repeat in 2 hrs, if needed.  Max dose: 2 tabs/day. This is a 30 day prescription. 05/19/23   Wess Hammed, NP  Fremanezumab -vfrm (AJOVY ) 225 MG/1.5ML SOAJ Inject 225 mg into the skin every 30 (thirty) days. 05/19/23   Wess Hammed, NP  meloxicam (MOBIC) 15 MG tablet TK 1 T PO QD 06/01/18   [provider]  Multiple Vitamin (MULTIVITAMIN) tablet Take 1 tablet by mouth daily.      [provider]  ondansetron  (ZOFRAN ) 4 MG tablet TAKE 1 TABLET(4 MG) BY MOUTH EVERY 8 HOURS AS NEEDED FOR NAUSEA OR VOMITING 05/19/23   Wess Hammed, NP  telmisartan -hydrochlorothiazide  (MICARDIS  HCT) 40-12.5 MG tablet Take 1 tablet by mouth daily. 12/31/21     tirzepatide  (MOUNJARO ) 15 MG/0.5ML Pen Inject 15 mg into the skin once a week. 12/11/22     topiramate  (TOPAMAX ) 100 MG tablet Take 2 tablets (200 mg total) by mouth at bedtime. 05/19/23   Wess Hammed, NP  vitamin B-12 (CYANOCOBALAMIN ) 100 MCG tablet Take 100 mcg by mouth daily.    [provider]    Allergies: Avocado    Review of Systems  Updated Vital Signs BP (!) 150/98   Pulse 70   Temp 98.6 F (37 C) (  Oral)   Resp (!) 31   SpO2 100%   Physical Exam Vitals and nursing note reviewed.  Constitutional:      General: She is not in acute distress.    Appearance: She is well-developed.  HENT:     Head: Normocephalic and atraumatic.     Right Ear: External ear normal.     Left Ear: External ear normal.     Nose: Nose normal.   Eyes:     Extraocular Movements: Extraocular movements intact.     Conjunctiva/sclera: Conjunctivae normal.     Pupils: Pupils are equal, round, and reactive to light.    Cardiovascular:     Rate and Rhythm: Normal rate and regular rhythm.     Heart sounds: No murmur heard. Pulmonary:     Effort: Pulmonary effort is normal. No respiratory distress.      Breath sounds: Normal breath sounds.   Musculoskeletal:     Cervical back: Normal range of motion and neck supple.     Right lower leg: Edema present.     Left lower leg: Edema present.   Skin:    General: Skin is warm and dry.   Neurological:     Mental Status: She is alert and oriented to person, place, and time. Mental status is at baseline.   Psychiatric:        Mood and Affect: Mood normal.     (all labs ordered are listed, but only abnormal results are displayed) Labs Reviewed  COMPREHENSIVE METABOLIC PANEL WITH GFR - Abnormal; Notable for the following components:      Result Value   CO2 20 (*)    All other components within normal limits  CBC - Abnormal; Notable for the following components:   RBC 3.26 (*)    Hemoglobin 10.0 (*)    HCT 29.6 (*)    All other components within normal limits  PRO BRAIN NATRIURETIC PEPTIDE - Abnormal; Notable for the following components:   Pro Brain Natriuretic Peptide 309.0 (*)    All other components within normal limits  LIPASE, BLOOD    EKG: None  Radiology: No results found.   Procedures   Medications Ordered in the ED - No data to display                                  Medical Decision Making Amount and/or Complexity of Data Reviewed Labs: ordered.  Risk Prescription drug management.   48 year old female with a history of diabetes and hypertension and lower extremity edema who presents emergency department with bilateral lower extremity swelling  Initial Ddx:  Venous insufficiency, lymphedema, CHF, renal disease, hepatic disease  MDM/Course:  Patient presents to the emergency department with several days of lower extremity swelling.  Has not been taking her hypertensives.  Denies any shortness of breath or chest pain.  On exam does have significant lower extremity edema bilaterally.  No rales appreciated.  Low concern for DVT given the bilateral nature of her swelling.  Did send off lab work which included a  BNP which was newly elevated for the patient.  Since she is currently on room air not complaining of any shortness of breath or chest pain feel that this can be further evaluated by cardiology as an outpatient so a referral was placed.  Patient given a prescription to take of Lasix and instructed on the importance of taking her blood pressure medications even  when she goes on vacation since uncontrolled hypertension can lead to heart failure.    This patient presents to the ED for concern of complaints listed in HPI, this involves an extensive number of treatment options, and is a complaint that carries with it a high risk of complications and morbidity. Disposition including potential need for admission considered.   Dispo: DC Home. Return precautions discussed including, but not limited to, those listed in the AVS. Allowed pt time to ask questions which were answered fully prior to dc.  Additional history obtained from mother Records reviewed Outpatient Clinic Notes The following labs were independently interpreted: Chemistry and show no acute abnormality I personally reviewed and interpreted cardiac monitoring: normal sinus rhythm  I have reviewed the patients home medications and made adjustments as needed  Portions of this note were generated with Dragon dictation software. Dictation errors may occur despite best attempts at proofreading.     Final diagnoses:  Leg swelling  Elevated brain natriuretic peptide (BNP) level    ED Discharge Orders          Ordered    furosemide (LASIX) 20 MG tablet  Daily PRN        08/07/23 1703    Ambulatory referral to Cardiology        08/07/23 1746               Ninetta Basket, MD 08/07/23 1807

## 2023-08-09 ENCOUNTER — Ambulatory Visit
Admission: RE | Admit: 2023-08-09 | Discharge: 2023-08-09 | Disposition: A | Discharge status: Home / Self Care | Source: Ambulatory Visit | Attending: Family Medicine | Admitting: Family Medicine

## 2023-08-09 DIAGNOSIS — R928 Other abnormal and inconclusive findings on diagnostic imaging of breast: Secondary | ICD-10-CM | POA: Diagnosis not present

## 2023-08-09 DIAGNOSIS — N6489 Other specified disorders of breast: Secondary | ICD-10-CM

## 2023-08-11 DIAGNOSIS — D649 Anemia, unspecified: Secondary | ICD-10-CM | POA: Diagnosis not present

## 2023-08-11 DIAGNOSIS — I10 Essential (primary) hypertension: Secondary | ICD-10-CM | POA: Diagnosis not present

## 2023-08-11 DIAGNOSIS — R600 Localized edema: Secondary | ICD-10-CM | POA: Diagnosis not present

## 2023-08-12 ENCOUNTER — Ambulatory Visit: Attending: Cardiology | Admitting: Cardiology

## 2023-08-12 ENCOUNTER — Encounter: Payer: Self-pay | Admitting: Cardiology

## 2023-08-12 VITALS — BP 122/72 | HR 82 | Ht 66.0 in | Wt 254.6 lb

## 2023-08-12 DIAGNOSIS — R600 Localized edema: Secondary | ICD-10-CM | POA: Diagnosis not present

## 2023-08-12 DIAGNOSIS — I10 Essential (primary) hypertension: Secondary | ICD-10-CM | POA: Diagnosis not present

## 2023-08-12 DIAGNOSIS — R9431 Abnormal electrocardiogram [ECG] [EKG]: Secondary | ICD-10-CM | POA: Diagnosis not present

## 2023-08-12 DIAGNOSIS — I517 Cardiomegaly: Secondary | ICD-10-CM | POA: Diagnosis not present

## 2023-08-12 NOTE — Progress Notes (Signed)
 Cardiology CONSULT Note    Date:  08/12/2023   ID:  Jamas Maywood, DOB [birth year], MRN [id number]  PCP:  Olin Bertin, MD  Cardiologist:  Gaylyn Keas, MD   No chief complaint on file.   Patient Profile: Jessica Fritz is a 48 y.o. female who is being seen today for the evaluation of LE edema at the request of Ninetta Basket, MD.  History of Present Illness:  Jessica Fritz is a 48 y.o. female who is being seen today for the evaluation of LE edema at the request of Ninetta Basket, MD.  This is a 48 year old morbidly obese African-American female with a history of hypertension, right eye papilledema, migraine headaches, anxiety and depression.  She has been having problems with lower extremity edema and is now referred for further cardiac evaluation.  She tells me that she has had LE edema for a long time off and on and has been taking Lasix PRN.  Recently she had a bad episode of LE edema about 1-2 weeks ago.  She was in [place] at the time.  She drove for the trip.  It was very hot out and usually she has issues with swelling when it is hot. She also was eating out a lot and says that she was eating a lot of what she normally does not eat here.  By the time she got home her LE edema was pretty bad.  She could not feel the bottom of her right foot and could not drive anymore.  She ran out of her meds while she was in [place] as well.  Labs included mildly elevated BNP at 309, SCr 0.85, K+ 4.1, Hbg 10 . She ordered compression hose on [employer] about 2 weeks ago. She saw her PCP yesterday but was told to continue her meds that she is on and take Lasix 20mg  daily.   Past Medical History:  Diagnosis Date   Anxiety    Depression    Headache(784.0)    Hypertension    Migraine    Papilledema, right eye    Vitamin D deficiency     Past Surgical History:  Procedure Laterality Date   EYE SURGERY      Current Medications: Current Meds  Medication Sig   Fremanezumab -vfrm  (AJOVY ) 225 MG/1.5ML SOAJ Inject 225 mg into the skin every 30 (thirty) days.    Allergies:   Avocado   Social History   Socioeconomic History   Marital status: Single    Spouse name: Not on file   Number of children: 0   Years of education: some college   Highest education level: Not on file  Occupational History   Not on file  Tobacco Use   Smoking status: Never   Smokeless tobacco: Never  Vaping Use   Vaping status: Never Used  Substance and Sexual Activity   Alcohol use: Yes    Comment: occ   Drug use: No   Sexual activity: Never    Birth control/protection: None  Other Topics Concern   Not on file  Social History Narrative   Lives at home alone.   Right-handed.   3-4 cups caffeine per day.   Social Drivers of Corporate investment banker Strain: Not on file  Food Insecurity: Not on file  Transportation Needs: Not on file  Physical Activity: Not on file  Stress: Not on file  Social Connections: Unknown (06/25/2021)   Received [facility]   Social  Network    Social Network: Not on file     Family History:  The patient's family history includes Alcohol abuse in her paternal grandfather; Diabetes in her father; Healthy in her mother.   ROS:   Please see the history of present illness.    ROS All other systems reviewed and are negative.     08/10/2023    5:53 PM  PAD Screen  Previous PAD dx? No  Previous surgical procedure? No  Pain with walking? Yes  Subsides with rest? Yes  Feet/toe relief with dangling? No  Painful, non-healing ulcers? No  Extremities discolored? No       PHYSICAL EXAM:   VS:  BP 122/72   Pulse 82   Ht 5' 6 (1.676 m)   Wt 254 lb 9.6 oz (115.5 kg)   SpO2 99%   BMI 41.09 kg/m    GEN: Well nourished, well developed, in no acute distress  HEENT: normal  Neck: no JVD, carotid bruits, or masses Cardiac: RRR; no murmurs, rubs, or gallops,no edema.  Intact distal pulses bilaterally.  Respiratory:  clear to auscultation  bilaterally, normal work of breathing GI: soft, nontender, nondistended, + BS MS: no deformity or atrophy  Skin: warm and dry, no rash Neuro:  Alert and Oriented x 3, Strength and sensation are intact Psych: euthymic mood, full affect  Wt Readings from Last 3 Encounters:  08/12/23 254 lb 9.6 oz (115.5 kg)  05/19/23 253 lb (114.8 kg)  05/21/22 271 lb (122.9 kg)      Studies/Labs Reviewed:   EKG Interpretation Date/Time:  Thursday August 12 2023 15:13:14 EDT Ventricular Rate:  82 PR Interval:  164 QRS Duration:  76 QT Interval:  366 QTC Calculation: 427 R Axis:   -1  Text Interpretation: Normal sinus rhythm Minimal voltage criteria for LVH, may be normal variant ( R in aVL ) Cannot rule out Anterior infarct , age undetermined When compared with ECG of 24-Jun-2016 17:51, PREVIOUS ECG IS PRESENT Confirmed by Gaylyn Keas ([id number]) on 08/12/2023 3:17:04 PM       Recent Labs: 08/07/2023: ALT 22; BUN 10; Creatinine, Ser 0.85; Hemoglobin 10.0; Platelets 276; Potassium 4.1; Pro Brain Natriuretic Peptide 309.0; Sodium 138   Lipid Panel No results found for: CHOL, TRIG, HDL, CHOLHDL, VLDL, LDLCALC, LDLDIRECT   ASSESSMENT:    1. Edema of lower extremity   2. Essential hypertension   3. Nonspecific abnormal electrocardiogram (ECG) (EKG)   4. LVH (left ventricular hypertrophy)      PLAN:  In order of problems listed above:  #Lower extremity edema - Suspect multifactorial from chronic venous insufficiency/morbid obesity/sedentary state - Encouraged to follow less than 2 g sodium diet - Encouraged weight loss/exercise routine - Check 2D echo to assess LV function - Continue Lasix 20 mg daily  - check BLE venous dopplers to rule out DVT given recent long trip  #Hypertension - BP controlled on exam today - Continue telmisartan  40 mg daily with as needed refills  #Abnormal EKG #LVH - 2D echo today shows LVH - Will get 2D echo to assess for LV hypertrophy   Time  Spent: 20 minutes total time of encounter, including 15 minutes spent in face-to-face patient care on the date of this encounter. This time includes coordination of care and counseling regarding above mentioned problem list. Remainder of non-face-to-face time involved reviewing chart documents/testing relevant to the patient encounter and documentation in the medical record. I have independently reviewed documentation from referring provider  Followup:  PRN  Medication Adjustments/Labs and Tests Ordered: Current medicines are reviewed at length with the patient today.  Concerns regarding medicines are outlined above.  Medication changes, Labs and Tests ordered today are listed in the Patient Instructions below.  There are no Patient Instructions on file for this visit.   Signed, Gaylyn Keas, MD  08/12/2023 3:19 PM    [facility] [address] [place], [place], [place]  [address] Phone: [phone]; Fax: [phone]

## 2023-08-12 NOTE — Addendum Note (Signed)
 Addended by: Cherylyn Cos on: 08/12/2023 03:38 PM   Modules accepted: Orders

## 2023-08-12 NOTE — Patient Instructions (Signed)
 Medication Instructions:  Your physician recommends that you continue on your current medications as directed. Please refer to the Current Medication list given to you today.   *If you need a refill on your cardiac medications before your next appointment, please call your pharmacy*  Lab Work: None.  If you have labs (blood work) drawn today and your tests are completely normal, you will receive your results only by: MyChart Message (if you have MyChart) OR A paper copy in the mail If you have any lab test that is abnormal or we need to change your treatment, we will call you to review the results.  Testing/Procedures: Your physician has requested that you have an echocardiogram. Echocardiography is a painless test that uses sound waves to create images of your heart. It provides your doctor with information about the size and shape of your heart and how well your heart's chambers and valves are working. This procedure takes approximately one hour. There are no restrictions for this procedure. Please do NOT wear cologne, perfume, aftershave, or lotions (deodorant is allowed). Please arrive 15 minutes prior to your appointment time.  Please note: We ask at that you not bring children with you during ultrasound (echo/ vascular) testing. Due to room size and safety concerns, children are not allowed in the ultrasound rooms during exams. Our front office staff cannot provide observation of children in our lobby area while testing is being conducted. An adult accompanying a patient to their appointment will only be allowed in the ultrasound room at the discretion of the ultrasound technician under special circumstances. We apologize for any inconvenience.  Your physician has requested that you have a lower extremity venous duplex. This test is an ultrasound of the veins in the legs or arms. It looks at venous blood flow that carries blood from the heart to the legs or arms. Allow one hour for a Lower  Venous exam. Allow thirty minutes for an Upper Venous exam. There are no restrictions or special instructions.  Please note: We ask at that you not bring children with you during ultrasound (echo/ vascular) testing. Due to room size and safety concerns, children are not allowed in the ultrasound rooms during exams. Our front office staff cannot provide observation of children in our lobby area while testing is being conducted. An adult accompanying a patient to their appointment will only be allowed in the ultrasound room at the discretion of the ultrasound technician under special circumstances. We apologize for any inconvenience.   Follow-Up: At [facility], you and your health needs are our priority.  As part of our continuing mission to provide you with exceptional heart care, our providers are all part of one team.  This team includes your primary Cardiologist (physician) and Advanced Practice Providers or APPs (Physician Assistants and Nurse Practitioners) who all work together to provide you with the care you need, when you need it.  Your next appointment:   1 year(s)  Provider:   Dr. Gaylyn Keas, MD   We recommend signing up for the patient portal called MyChart.  Sign up information is provided on this After Visit Summary.  MyChart is used to connect with patients for Virtual Visits (Telemedicine).  Patients are able to view lab/test results, encounter notes, upcoming appointments, etc.  Non-urgent messages can be sent to your provider as well.   To learn more about what you can do with MyChart, go to [email or web address].

## 2023-08-17 ENCOUNTER — Emergency Department (HOSPITAL_BASED_OUTPATIENT_CLINIC_OR_DEPARTMENT_OTHER): Admitting: Radiology

## 2023-08-17 ENCOUNTER — Encounter (HOSPITAL_BASED_OUTPATIENT_CLINIC_OR_DEPARTMENT_OTHER): Payer: Self-pay | Admitting: Emergency Medicine

## 2023-08-17 ENCOUNTER — Emergency Department (HOSPITAL_BASED_OUTPATIENT_CLINIC_OR_DEPARTMENT_OTHER)
Admission: EM | Admit: 2023-08-17 | Discharge: 2023-08-17 | Disposition: A | Discharge status: Home / Self Care | Attending: Emergency Medicine | Admitting: Emergency Medicine

## 2023-08-17 ENCOUNTER — Emergency Department (HOSPITAL_BASED_OUTPATIENT_CLINIC_OR_DEPARTMENT_OTHER)

## 2023-08-17 DIAGNOSIS — R079 Chest pain, unspecified: Secondary | ICD-10-CM | POA: Insufficient documentation

## 2023-08-17 DIAGNOSIS — I10 Essential (primary) hypertension: Secondary | ICD-10-CM | POA: Diagnosis not present

## 2023-08-17 DIAGNOSIS — R0789 Other chest pain: Secondary | ICD-10-CM | POA: Diagnosis not present

## 2023-08-17 DIAGNOSIS — Z7982 Long term (current) use of aspirin: Secondary | ICD-10-CM | POA: Diagnosis not present

## 2023-08-17 DIAGNOSIS — M7989 Other specified soft tissue disorders: Secondary | ICD-10-CM | POA: Diagnosis not present

## 2023-08-17 DIAGNOSIS — E119 Type 2 diabetes mellitus without complications: Secondary | ICD-10-CM | POA: Insufficient documentation

## 2023-08-17 DIAGNOSIS — R0602 Shortness of breath: Secondary | ICD-10-CM | POA: Diagnosis not present

## 2023-08-17 DIAGNOSIS — M79605 Pain in left leg: Secondary | ICD-10-CM | POA: Diagnosis not present

## 2023-08-17 DIAGNOSIS — M79604 Pain in right leg: Secondary | ICD-10-CM | POA: Diagnosis not present

## 2023-08-17 LAB — CBC
HCT: 38.4 % (ref 36.0–46.0)
Hemoglobin: 13.2 g/dL (ref 12.0–15.0)
MCH: 30.8 pg (ref 26.0–34.0)
MCHC: 34.4 g/dL (ref 30.0–36.0)
MCV: 89.5 fL (ref 80.0–100.0)
Platelets: 386 10*3/uL (ref 150–400)
RBC: 4.29 MIL/uL (ref 3.87–5.11)
RDW: 14.1 % (ref 11.5–15.5)
WBC: 7.5 10*3/uL (ref 4.0–10.5)
nRBC: 0 % (ref 0.0–0.2)

## 2023-08-17 LAB — TROPONIN T, HIGH SENSITIVITY
Troponin T High Sensitivity: 30 ng/L — ABNORMAL HIGH (ref <19)
Troponin T High Sensitivity: 28 ng/L — ABNORMAL HIGH (ref <19)

## 2023-08-17 LAB — BASIC METABOLIC PANEL WITH GFR
Anion gap: 13 (ref 5–15)
BUN: 13 mg/dL (ref 6–20)
CO2: 26 mmol/L (ref 22–32)
Calcium: 9.6 mg/dL (ref 8.9–10.3)
Chloride: 97 mmol/L — ABNORMAL LOW (ref 98–111)
Creatinine, Ser: 0.73 mg/dL (ref 0.44–1.00)
GFR, Estimated: >60 mL/min (ref >60)
Glucose, Bld: 82 mg/dL (ref 70–99)
Potassium: 3.6 mmol/L (ref 3.5–5.1)
Sodium: 136 mmol/L (ref 135–145)

## 2023-08-17 LAB — PREGNANCY, URINE: Preg Test, Ur: NEGATIVE

## 2023-08-17 LAB — PRO BRAIN NATRIURETIC PEPTIDE: Pro Brain Natriuretic Peptide: <36 pg/mL (ref <300.0)

## 2023-08-17 LAB — D-DIMER, QUANTITATIVE: D-Dimer, Quant: 0.38 ug{FEU}/mL (ref 0.00–0.50)

## 2023-08-17 NOTE — ED Provider Notes (Signed)
 [place] EMERGENCY DEPARTMENT AT [facility] Provider Note   CSN: [id number] Arrival date & time: 08/17/23  1409     Patient presents with: Chest Pain and Shortness of Breath   Jessica Fritz is a 48 y.o. female.  Past medical history significant for diabetes, lower extremity edema, hypertension, and migraine presents today for chest pain shortness of breath along with mild bilateral leg swelling.  Patient was seen on 6/14 for same and followed up with cards a few days ago.  Patient is scheduled for an echo and bilateral DVT studies in the upcoming weeks.  Patient reports that her shortness of breath and chest pain worsened yesterday.  Patient reports taking Lasix  with some relief of leg swelling.    Chest Pain Associated symptoms: shortness of breath   Shortness of Breath Associated symptoms: chest pain        Prior to Admission medications   Medication Sig Start Date End Date Taking? Authorizing Provider  Ascorbic Acid (VITAMIN C) 100 MG tablet Take 100 mg by mouth daily.    [provider]  aspirin-acetaminophen -caffeine (EXCEDRIN MIGRAINE) 250-250-65 MG tablet Take 2 tablets by mouth every 6 (six) hours as needed for headache.    [provider]  calcium carbonate (TUMS - DOSED IN MG ELEMENTAL CALCIUM) 500 MG chewable tablet Chew 2 tablets by mouth daily.      [provider]  Continuous Glucose Sensor (DEXCOM G7 SENSOR) MISC Inject 1 each into the skin every 10 days and use to monitor blood sugar 3 or more times a day. 06/15/23     Continuous Glucose Sensor (FREESTYLE LIBRE 3 PLUS SENSOR) MISC Use as directed to continuously monitor blood sugar. Change every 15 days. 06/15/23   Verena Mems, MD  cyclobenzaprine  (FLEXERIL ) 10 MG tablet Take 1 tablet (10 mg total) by mouth 2 (two) times daily as needed for muscle spasms. 09/10/17   Jamelle Lorrayne HERO, NP  eletriptan  (RELPAX ) 40 MG tablet Take 1 tab at onset of migraine.  May repeat in 2 hrs, if  needed.  Max dose: 2 tabs/day. This is a 30 day prescription. 05/19/23   Gayland Lauraine PARAS, NP  Fremanezumab -vfrm (AJOVY ) 225 MG/1.5ML SOAJ Inject 225 mg into the skin every 30 (thirty) days. 05/19/23   Gayland Lauraine PARAS, NP  furosemide  (LASIX ) 20 MG tablet Take 1 tablet (20 mg total) by mouth daily as needed for edema. 08/07/23   Yolande Lamar BROCKS, MD  meloxicam (MOBIC) 15 MG tablet TK 1 T PO QD 06/01/18   [provider]  Multiple Vitamin (MULTIVITAMIN) tablet Take 1 tablet by mouth daily.      [provider]  ondansetron  (ZOFRAN ) 4 MG tablet TAKE 1 TABLET(4 MG) BY MOUTH EVERY 8 HOURS AS NEEDED FOR NAUSEA OR VOMITING 05/19/23   Gayland Lauraine PARAS, NP  telmisartan -hydrochlorothiazide  (MICARDIS  HCT) 40-12.5 MG tablet Take 1 tablet by mouth daily. 12/31/21     tirzepatide  (MOUNJARO ) 15 MG/0.5ML Pen Inject 15 mg into the skin once a week. 12/11/22     topiramate  (TOPAMAX ) 100 MG tablet Take 2 tablets (200 mg total) by mouth at bedtime. 05/19/23   Gayland Lauraine PARAS, NP  vitamin B-12 (CYANOCOBALAMIN ) 100 MCG tablet Take 100 mcg by mouth daily.    [provider]    Allergies: Avocado    Review of Systems  Respiratory:  Positive for shortness of breath.   Cardiovascular:  Positive for chest pain and leg swelling.    Updated Vital Signs BP ROLLEN)  137/90   Pulse 86   Temp 98.6 F (37 C)   Resp 18   LMP 08/03/2023 (Approximate)   SpO2 99%   Physical Exam Vitals and nursing note reviewed.  Constitutional:      General: She is not in acute distress.    Appearance: She is well-developed. She is obese.  HENT:     Head: Normocephalic and atraumatic.   Eyes:     Conjunctiva/sclera: Conjunctivae normal.    Cardiovascular:     Rate and Rhythm: Normal rate and regular rhythm.     Heart sounds: Normal heart sounds. No murmur heard. Pulmonary:     Effort: Pulmonary effort is normal. No respiratory distress.     Breath sounds: Normal breath sounds.  Abdominal:     Palpations: Abdomen  is soft.     Tenderness: There is no abdominal tenderness.   Musculoskeletal:        General: No swelling.     Cervical back: Neck supple.     Comments: Bilateral leg swelling without pitting edema.  No tenderness to palpation of bilateral posterior knees or calves.   Skin:    General: Skin is warm and dry.     Capillary Refill: Capillary refill takes less than 2 seconds.   Neurological:     General: No focal deficit present.     Mental Status: She is alert and oriented to person, place, and time.   Psychiatric:        Mood and Affect: Mood normal.     (all labs ordered are listed, but only abnormal results are displayed) Labs Reviewed  BASIC METABOLIC PANEL WITH GFR - Abnormal; Notable for the following components:      Result Value   Chloride 97 (*)    All other components within normal limits  TROPONIN T, HIGH SENSITIVITY - Abnormal; Notable for the following components:   Troponin T High Sensitivity 30 (*)    All other components within normal limits  TROPONIN T, HIGH SENSITIVITY - Abnormal; Notable for the following components:   Troponin T High Sensitivity 28 (*)    All other components within normal limits  CBC  PREGNANCY, URINE  PRO BRAIN NATRIURETIC PEPTIDE  D-DIMER, QUANTITATIVE    EKG: EKG Interpretation Date/Time:  Tuesday August 17 2023 14:19:59 EDT Ventricular Rate:  90 PR Interval:  172 QRS Duration:  78 QT Interval:  366 QTC Calculation: 447 R Axis:   21  Text Interpretation: Normal sinus rhythm Minimal voltage criteria for LVH, may be normal variant ( R in aVL ) Nonspecific T wave abnormality Abnormal ECG When compared with ECG of 12-Aug-2023 15:13, No significant change was found Confirmed by Francesca Fallow ([id number]) on 08/17/2023 5:35:42 PM  Radiology: US  Venous Img Lower Bilateral Result Date: 08/17/2023 CLINICAL DATA:  leg swelling and pain EXAM: BILATERAL LOWER EXTREMITY VENOUS DOPPLER ULTRASOUND TECHNIQUE: Gray-scale sonography with  compression, as well as color and duplex ultrasound, were performed to evaluate the deep venous system(s) from the level of the common femoral vein through the popliteal and proximal calf veins. COMPARISON:  06/24/2016 and previous FINDINGS: VENOUS Normal compressibility of the common femoral, superficial femoral, and popliteal veins, as well as the visualized calf veins. Visualized portions of profunda femoral vein and great saphenous vein unremarkable. No filling defects to suggest DVT on grayscale or color Doppler imaging. Doppler waveforms show normal direction of venous flow, normal respiratory plasticity and response to augmentation. OTHER None. Limitations: none IMPRESSION: Negative. Negative. Electronically Signed  By: JONETTA Faes M.D.   On: 08/17/2023 17:28   DG Chest 2 View Result Date: 08/17/2023 CLINICAL DATA:  Chest pain, shortness of breath EXAM: CHEST - 2 VIEW COMPARISON:  07/24/2020 FINDINGS: The heart size and mediastinal contours are within normal limits. Both lungs are clear. The visualized skeletal structures are unremarkable. IMPRESSION: No active cardiopulmonary disease. Electronically Signed   By: Franky Crease M.D.   On: 08/17/2023 15:08     Procedures   Medications Ordered in the ED - No data to display                                  Medical Decision Making Amount and/or Complexity of Data Reviewed Labs: ordered. Radiology: ordered.   This patient presents to the ED for concern of chest pain and shortness of breath, this involves an extensive number of treatment options, and is a complaint that carries with it a high risk of complications and morbidity.  The differential diagnosis includes electrolyte abnormality, STEMI, NSTEMI, arrhythmia, GERD, PE, DVT, CHF exacerbation  Additional history obtained:  Additional history obtained from EMR External records from outside source obtained and reviewed including cardiology office visit note   Lab Tests:  I Ordered,  and personally interpreted labs.  The pertinent results include: CBC, BMP, and BNP WNL, elevated troponin at 30, 28, negative pregnancy, negative D-dimer   Imaging Studies ordered:  I ordered imaging studies including chest x-ray and bilateral DVT studies I independently visualized and interpreted imaging which showed no active cardiopulmonary disease and negative DVT studies bilaterally I agree with the radiologist interpretation   Cardiac Monitoring: / EKG:  The patient was maintained on a cardiac monitor.  I personally viewed and interpreted the cardiac monitored which showed an underlying rhythm of: Normal sinus rhythm   Problem List / ED Course / Critical interventions / Medication management Patient able to ambulate on room air while maintaining pulse ox of 100%.  Test / Admission - Considered:  Considered for admission or further workup however patient's vital signs, physical exam, labs, and imaging of been reassuring.  Patient to follow-up with primary care/cardiology if her symptoms persist for further evaluation workup.  Patient given return precautions.  I feel patient safe for discharge at this time.     Final diagnoses:  Shortness of breath  Chest pain, unspecified type    ED Discharge Orders     None          Francis Ileana LOISE DEVONNA 08/17/23 1808    Francesca Elsie CROME, MD 08/18/23 [phone]

## 2023-08-17 NOTE — Discharge Instructions (Signed)
 Today you were seen for shortness of breath and chest pain.  Please continue taking your Lasix  and follow-up with cardiology for further evaluation and workup if your symptoms continue.  Please return to the ED if you have worsening shortness of breath, uncontrollable vomiting, or worsening chest pain.  Thank you for letting us  treat you today. After reviewing your labs and imaging, I feel you are safe to go home. Please follow up with your PCP in the next several days and provide them with your records from this visit. Return to the Emergency Room if pain becomes severe or symptoms worsen.

## 2023-08-17 NOTE — ED Notes (Signed)
 RT Note: Patient oxygen saturation on room air while ambulating 100%, HR 89, RR 19  Patient tolerated the walk well

## 2023-08-17 NOTE — ED Triage Notes (Signed)
 Chest pain and sob, bilateral leg swelling ( improved )  Worse sob and chest pain since yesterday   Seen 08/07/23 for same, f/u with cards

## 2023-08-20 ENCOUNTER — Other Ambulatory Visit (HOSPITAL_BASED_OUTPATIENT_CLINIC_OR_DEPARTMENT_OTHER): Payer: Self-pay

## 2023-08-20 MED ORDER — FUROSEMIDE 20 MG PO TABS
20.0000 mg | ORAL_TABLET | Freq: Every day | ORAL | 0 refills | Status: AC | PRN
  Filled 2023-08-20 – 2023-08-24 (×2): qty 30, 30d supply, fill #0
  Filled 2023-08-25: qty 90, 90d supply, fill #0

## 2023-08-24 ENCOUNTER — Other Ambulatory Visit (HOSPITAL_BASED_OUTPATIENT_CLINIC_OR_DEPARTMENT_OTHER): Payer: Self-pay

## 2023-08-25 ENCOUNTER — Other Ambulatory Visit (HOSPITAL_BASED_OUTPATIENT_CLINIC_OR_DEPARTMENT_OTHER): Payer: Self-pay

## 2023-08-25 ENCOUNTER — Ambulatory Visit: Attending: Cardiology

## 2023-08-25 DIAGNOSIS — I10 Essential (primary) hypertension: Secondary | ICD-10-CM | POA: Diagnosis not present

## 2023-08-25 DIAGNOSIS — R600 Localized edema: Secondary | ICD-10-CM | POA: Diagnosis not present

## 2023-08-25 DIAGNOSIS — R9431 Abnormal electrocardiogram [ECG] [EKG]: Secondary | ICD-10-CM | POA: Diagnosis not present

## 2023-08-25 DIAGNOSIS — I517 Cardiomegaly: Secondary | ICD-10-CM | POA: Diagnosis not present

## 2023-08-25 LAB — ECHOCARDIOGRAM COMPLETE
AV Mean grad: 4 mmHg
AV Peak grad: 7.5 mmHg
Ao pk vel: 1.37 m/s
Area-P 1/2: 3.6 cm2

## 2023-08-25 MED ORDER — PERFLUTREN LIPID MICROSPHERE
1.0000 mL | INTRAVENOUS | Status: AC | PRN
  Administered 2023-08-25: 3 mL via INTRAVENOUS

## 2023-08-30 ENCOUNTER — Ambulatory Visit: Payer: Self-pay

## 2023-08-31 ENCOUNTER — Other Ambulatory Visit (HOSPITAL_BASED_OUTPATIENT_CLINIC_OR_DEPARTMENT_OTHER): Payer: Self-pay

## 2023-09-02 ENCOUNTER — Encounter (HOSPITAL_COMMUNITY)

## 2023-09-03 DIAGNOSIS — R0602 Shortness of breath: Secondary | ICD-10-CM | POA: Diagnosis not present

## 2023-09-06 ENCOUNTER — Other Ambulatory Visit: Payer: Self-pay | Admitting: Family Medicine

## 2023-09-06 DIAGNOSIS — R0602 Shortness of breath: Secondary | ICD-10-CM

## 2023-09-07 ENCOUNTER — Other Ambulatory Visit (HOSPITAL_COMMUNITY): Payer: Self-pay

## 2023-09-07 ENCOUNTER — Telehealth: Payer: Self-pay

## 2023-09-07 ENCOUNTER — Ambulatory Visit
Admission: RE | Admit: 2023-09-07 | Discharge: 2023-09-07 | Disposition: A | Discharge status: Home / Self Care | Source: Ambulatory Visit | Attending: Family Medicine | Admitting: Family Medicine

## 2023-09-07 ENCOUNTER — Ambulatory Visit: Payer: Self-pay

## 2023-09-07 DIAGNOSIS — R0602 Shortness of breath: Secondary | ICD-10-CM

## 2023-09-07 DIAGNOSIS — R0600 Dyspnea, unspecified: Secondary | ICD-10-CM

## 2023-09-07 DIAGNOSIS — Z01812 Encounter for preprocedural laboratory examination: Secondary | ICD-10-CM

## 2023-09-07 MED ORDER — METOPROLOL TARTRATE 100 MG PO TABS
100.0000 mg | ORAL_TABLET | Freq: Once | ORAL | 0 refills | Status: DC
  Filled 2023-09-07: qty 1, 1d supply, fill #0

## 2023-09-07 NOTE — Telephone Encounter (Addendum)
 FYI Only or Action Required?: FYI only for provider.  Patient is followed in Pulmonology for New Pt.  Called Nurse Triage reporting Referral.  Symptoms began several months ago.  Interventions attempted: Other: following PCP/specialists that referred to [facility].  Symptoms are: unchanged.  Triage Disposition: Call Specialist When Office Open  Patient/caregiver understands and will follow disposition?: Yes  Please note that triage call was limited due to poor connection that was weather related.   Copied from CRM [id number]. Topic: Clinical - Red Word Triage >> Sep 07, 2023  4:34 PM Celestine FALCON wrote: Red Word that prompted transfer to Nurse Triage: New patient with SOB and difficulty breathing experiencing since [facility] June 2025. Referral from Dr. Chet for SOB no appt made. Reason for Disposition  Requesting regular office appointment  Additional Information  Commented on: Answer Assessment    Pt calling to schedule referral appt-- PAS transferred to NT d/t being symptomatic. Pt has been experiencing sx for a few months, with no acute sx. Triager was able to locate referral and scheduled soonest appt available with [facility]. Triager advised pt to continue to F/U with PCP for any acute issues until she can be established with [facility]. Patient verbalized understanding.  Answer Assessment - Initial Assessment Questions 1. RESPIRATORY STATUS: Describe your breathing? (e.g., wheezing, shortness of breath, unable to speak, severe coughing)      SOB 2. ONSET: When did this breathing problem begin?      June 2025 Reports hospitalization post-trip d/t BLE swelling, SOB 3. PATTERN Does the difficult breathing come and go, or has it been constant since it started?      Comes and goes 4. SEVERITY: How bad is your breathing? (e.g., mild, moderate, severe)      Mild - with exertion Triager does not appreciate audible SOB/wheezing during call. Pt is speaking in full sentences.  5. RECURRENT  SYMPTOM: Have you had difficulty breathing before? If Yes, ask: When was the last time? and What happened that time?      First time 6. CARDIAC HISTORY: Do you have any history of heart disease? (e.g., heart attack, angina, bypass surgery, angioplasty)      denies 7. LUNG HISTORY: Do you have any history of lung disease?  (e.g., pulmonary embolus, asthma, emphysema)     denies 8. CAUSE: What do you think is causing the breathing problem?      unknown 9. OTHER SYMPTOMS: Do you have any other symptoms? (e.g., chest pain, cough, dizziness, fever, runny nose)     Intermittent chest tightness - lasts a 1-2 minutes, that self resolves with rest -- does endorse hx of anxiety 10. O2 SATURATION MONITOR:  Do you use an oxygen saturation monitor (pulse oximeter) at home? If Yes, ask: What is your reading (oxygen level) today? What is your usual oxygen saturation reading? (e.g., 95%)       N/a 11. PREGNANCY: Is there any chance you are pregnant? When was your last menstrual period?       denies 60. TRAVEL: Have you traveled out of the country in the last month? (e.g., travel history, exposures)       [place] in May-June  Protocols used: Breathing Difficulty-A-AH, Information Only Call - No Triage-A-AH

## 2023-09-07 NOTE — Telephone Encounter (Signed)
 MC to patient to discuss Dr. Dorine recommendations:  Please order coronary CTA to rule out CAD as an etiology of shortness of breath and chest pain. If these are normal then she needs to see her PCP. As far as lower extremity edema goes, she needs to follow a less than 2 g sodium diet with no added salt in her diet, avoid fast food and take her Lasix  as needed. I would like her to follow-up with her PCP this week.

## 2023-09-10 ENCOUNTER — Encounter (HOSPITAL_COMMUNITY): Payer: Self-pay

## 2023-09-10 LAB — BASIC METABOLIC PANEL WITH GFR
BUN/Creatinine Ratio: 19 (ref 9–23)
BUN: 13 mg/dL (ref 6–24)
CO2: 25 mmol/L (ref 20–29)
Calcium: 9.2 mg/dL (ref 8.7–10.2)
Chloride: 97 mmol/L (ref 96–106)
Creatinine, Ser: 0.68 mg/dL (ref 0.57–1.00)
Glucose: 74 mg/dL (ref 70–99)
Potassium: 4 mmol/L (ref 3.5–5.2)
Sodium: 136 mmol/L (ref 134–144)
eGFR: 109 mL/min/1.73 (ref >59)

## 2023-09-14 ENCOUNTER — Ambulatory Visit (HOSPITAL_COMMUNITY)
Admission: RE | Admit: 2023-09-14 | Discharge: 2023-09-14 | Disposition: A | Discharge status: Home / Self Care | Source: Ambulatory Visit | Attending: Cardiology | Admitting: Cardiology

## 2023-09-14 DIAGNOSIS — R600 Localized edema: Secondary | ICD-10-CM

## 2023-09-14 DIAGNOSIS — R0600 Dyspnea, unspecified: Secondary | ICD-10-CM | POA: Insufficient documentation

## 2023-09-14 DIAGNOSIS — R9431 Abnormal electrocardiogram [ECG] [EKG]: Secondary | ICD-10-CM

## 2023-09-14 MED ORDER — IOHEXOL 350 MG/ML SOLN
100.0000 mL | Freq: Once | INTRAVENOUS | Status: AC | PRN
  Administered 2023-09-14: 100 mL via INTRAVENOUS

## 2023-09-14 MED ORDER — NITROGLYCERIN 0.4 MG SL SUBL
0.8000 mg | SUBLINGUAL_TABLET | Freq: Once | SUBLINGUAL | Status: AC
  Administered 2023-09-14: 0.8 mg via SUBLINGUAL

## 2023-09-15 ENCOUNTER — Ambulatory Visit: Payer: Self-pay | Admitting: Cardiology

## 2023-09-20 DIAGNOSIS — D649 Anemia, unspecified: Secondary | ICD-10-CM | POA: Diagnosis not present

## 2023-09-29 ENCOUNTER — Other Ambulatory Visit (HOSPITAL_BASED_OUTPATIENT_CLINIC_OR_DEPARTMENT_OTHER): Payer: Self-pay

## 2023-09-29 DIAGNOSIS — R1013 Epigastric pain: Secondary | ICD-10-CM | POA: Diagnosis not present

## 2023-09-29 DIAGNOSIS — D649 Anemia, unspecified: Secondary | ICD-10-CM | POA: Diagnosis not present

## 2023-09-29 MED ORDER — OMEPRAZOLE 20 MG PO CPDR
20.0000 mg | DELAYED_RELEASE_CAPSULE | Freq: Every morning | ORAL | 0 refills | Status: DC
  Filled 2023-09-29: qty 30, 30d supply, fill #0

## 2023-10-01 DIAGNOSIS — F4322 Adjustment disorder with anxiety: Secondary | ICD-10-CM | POA: Diagnosis not present

## 2023-10-01 DIAGNOSIS — F4321 Adjustment disorder with depressed mood: Secondary | ICD-10-CM | POA: Diagnosis not present

## 2023-10-02 ENCOUNTER — Other Ambulatory Visit (HOSPITAL_BASED_OUTPATIENT_CLINIC_OR_DEPARTMENT_OTHER): Payer: Self-pay

## 2023-10-02 MED ORDER — AUVELITY 45-105 MG PO TBCR
EXTENDED_RELEASE_TABLET | ORAL | 11 refills | Status: AC
  Filled 2023-10-02: qty 60, 30d supply, fill #0

## 2023-10-12 ENCOUNTER — Other Ambulatory Visit (HOSPITAL_BASED_OUTPATIENT_CLINIC_OR_DEPARTMENT_OTHER): Payer: Self-pay

## 2023-10-21 ENCOUNTER — Encounter: Payer: Self-pay | Admitting: Pulmonary Disease

## 2023-10-21 ENCOUNTER — Other Ambulatory Visit (HOSPITAL_BASED_OUTPATIENT_CLINIC_OR_DEPARTMENT_OTHER): Payer: Self-pay

## 2023-10-21 ENCOUNTER — Ambulatory Visit: Admitting: Pulmonary Disease

## 2023-10-21 VITALS — BP 128/88 | HR 81 | Temp 98.1°F | Ht 66.0 in | Wt 251.6 lb

## 2023-10-21 DIAGNOSIS — R0602 Shortness of breath: Secondary | ICD-10-CM | POA: Diagnosis not present

## 2023-10-21 DIAGNOSIS — Z9189 Other specified personal risk factors, not elsewhere classified: Secondary | ICD-10-CM | POA: Diagnosis not present

## 2023-10-21 DIAGNOSIS — K219 Gastro-esophageal reflux disease without esophagitis: Secondary | ICD-10-CM

## 2023-10-21 DIAGNOSIS — E118 Type 2 diabetes mellitus with unspecified complications: Secondary | ICD-10-CM

## 2023-10-21 LAB — NITRIC OXIDE: Nitric Oxide: 16

## 2023-10-21 MED ORDER — ESOMEPRAZOLE MAGNESIUM 40 MG PO CPDR
40.0000 mg | DELAYED_RELEASE_CAPSULE | Freq: Every day | ORAL | 2 refills | Status: DC
  Filled 2023-10-21: qty 30, 30d supply, fill #0
  Filled 2023-11-23: qty 30, 30d supply, fill #1
  Filled 2023-12-10 – 2023-12-19 (×2): qty 30, 30d supply, fill #2

## 2023-10-21 NOTE — Progress Notes (Signed)
 Subjective:    Patient ID: Jessica Fritz, female    DOB: 1975/07/02, 48 y.o.   MRN: [id number]  Patient Care Team: Verena Mems, MD as PCP - General (Family Medicine) Glendia Simmonds, OD as Referring Physician [facility])  Chief Complaint  Patient presents with   Consult    Shortness of breath on exertion.     BACKGROUND: Patient is a 48 year old lifelong never smoker who presents for evaluation of shortness of breath on exertion.  She is kindly referred by Dr. Marca Schooner.  Her primary care physician is Dr. Mems Verena  HPI Discussed the use of AI scribe software for clinical note transcription with the patient, who gave verbal consent to proceed.  History of Present Illness   Jessica Fritz is a 48 year old female with diabetes who presents with shortness of breath.  She has experienced shortness of breath since May, which began after a trip to [place] . The symptoms worsened upon returning home, with significant difficulty breathing and swelling in her foot. She reports that at the time, she was evaluated in the emergency room and she was told her heart rate was elevated and that she had fluid overload. She occasionally experiences wheezing and a sensation of tightness and pain in her chest, particularly when short of breath. She also has occasional coughing.  She describes ongoing issues with her voice, stating that after talking for an hour or two, her voice becomes painful and she is unable to continue. She has been taking medication for acid reflux for almost a month, which has provided some improvement, but she still experiences throat pain, especially after eating or drinking certain foods and beverages like Coca Cola or spicy foods.  She does have globus sensation.  Regarding her sleep, she recalls an incident about a month ago where she awoke at 3 AM feeling as though she was choking and unable to breathe, which caused significant chest pain. Her parents, who were  visiting, were alarmed by the situation. She has a history of snoring and underwent a sleep study approximately eight years ago, which indicated slight issues but did not result in any treatment.  She has been on Mounjaro  for diabetes for a while, having been diagnosed with diabetes about two years ago.  Prior to Mounjaro  she was on Wegovy .    She works for [employer] [place].  No significant occupational exposure.  She does not endorse any fevers, chills or sweats.  No weight loss or anorexia.  Does not endorse any other symptomatology.  DATA 08/25/2023 echocardiogram: LVEF 60 to 65%, LV diastolic parameters normal.  RV function is normal.  RV size is normal.  No valvular abnormalities. 09/14/2023 coronary CT: Coronary calcium score of 0.  No significant coronary disease noted.  Visualized portions of the thorax show no suspicious appearing pulmonary nodules or masses.  No airspace disease.   Review of Systems A 10 point review of systems was performed and it is as noted above otherwise negative.   Past Medical History:  Diagnosis Date   Anxiety    Depression    Headache(784.0)    Hypertension    Migraine    Papilledema, right eye    Vitamin D deficiency     Past Surgical History:  Procedure Laterality Date   EYE SURGERY      Patient Active Problem List   Diagnosis Date Noted   Back pain 05/07/2021   Diabetes mellitus without complication (HCC) 05/07/2021   Edema of  lower extremity 05/07/2021   Essential hypertension 05/07/2021   Migraine 05/07/2021   Neck pain 05/07/2021   Oligomenorrhea 05/07/2021   Vitamin D deficiency 05/07/2021   Excessive sleepiness 12/07/2016   Chronic migraine w/o aura w/o status migrainosus, not intractable 10/15/2016   Major depressive disorder, recurrent episode, moderate (HCC) 12/26/2013   MDD (major depressive disorder), recurrent episode, moderate (HCC) 08/11/2013   GAD (generalized anxiety disorder) 08/11/2013    Family History  Problem  Relation Age of Onset   Healthy Mother    Diabetes Father    Alcohol abuse Paternal Grandfather    Anxiety disorder Neg Hx    Bipolar disorder Neg Hx    Depression Neg Hx    Suicidality Neg Hx    Breast cancer Neg Hx     Social History   Tobacco Use   Smoking status: Never   Smokeless tobacco: Never  Substance Use Topics   Alcohol use: Yes    Comment: occ    Allergies  Allergen Reactions   Avocado Swelling    Other reaction(s): face swelling Facial swelling     Current Meds  Medication Sig   Ascorbic Acid (VITAMIN C) 100 MG tablet Take 100 mg by mouth daily.   aspirin-acetaminophen -caffeine (EXCEDRIN MIGRAINE) 250-250-65 MG tablet Take 2 tablets by mouth every 6 (six) hours as needed for headache.   calcium carbonate (TUMS - DOSED IN MG ELEMENTAL CALCIUM) 500 MG chewable tablet Chew 2 tablets by mouth daily.   (Patient taking differently: Chew 2 tablets by mouth as needed.)   Continuous Glucose Sensor (DEXCOM G7 SENSOR) MISC Inject 1 each into the skin every 10 days and use to monitor blood sugar 3 or more times a day.   Continuous Glucose Sensor (FREESTYLE LIBRE 3 PLUS SENSOR) MISC Use as directed to continuously monitor blood sugar. Change every 15 days.   cyclobenzaprine  (FLEXERIL ) 10 MG tablet Take 1 tablet (10 mg total) by mouth 2 (two) times daily as needed for muscle spasms.   Dextromethorphan -buPROPion  ER (AUVELITY ) 45-105 MG TBCR Take 1 tablet by mouth twice daily (taken 8 hours apart).   eletriptan  (RELPAX ) 40 MG tablet Take 1 tab at onset of migraine.  May repeat in 2 hrs, if needed.  Max dose: 2 tabs/day. This is a 30 day prescription.   esomeprazole  (NEXIUM ) 40 MG capsule Take 1 capsule (40 mg total) by mouth daily.   Fremanezumab -vfrm (AJOVY ) 225 MG/1.5ML SOAJ Inject 225 mg into the skin every 30 (thirty) days.   furosemide  (LASIX ) 20 MG tablet Take 1 tablet (20 mg total) by mouth daily as needed for edema.   furosemide  (LASIX ) 20 MG tablet Take 1 tablet (20 mg  total) by mouth daily as needed for swelling   meloxicam (MOBIC) 15 MG tablet TK 1 T PO QD   metoprolol  tartrate (LOPRESSOR ) 100 MG tablet Take 1 tablet (100 mg total) by mouth once for 1 dose. Take 90-120 minutes prior to scan.   Multiple Vitamin (MULTIVITAMIN) tablet Take 1 tablet by mouth daily.     ondansetron  (ZOFRAN ) 4 MG tablet TAKE 1 TABLET(4 MG) BY MOUTH EVERY 8 HOURS AS NEEDED FOR NAUSEA OR VOMITING (Patient taking differently: every 8 (eight) hours as needed for nausea. TAKE 1 TABLET(4 MG) BY MOUTH EVERY 8 HOURS AS NEEDED FOR NAUSEA OR VOMITING)   telmisartan -hydrochlorothiazide  (MICARDIS  HCT) 40-12.5 MG tablet Take 1 tablet by mouth daily.   tirzepatide  (MOUNJARO ) 15 MG/0.5ML Pen Inject 15 mg into the skin once a week.  topiramate  (TOPAMAX ) 100 MG tablet Take 2 tablets (200 mg total) by mouth at bedtime.   vitamin B-12 (CYANOCOBALAMIN ) 100 MCG tablet Take 100 mcg by mouth daily.   [DISCONTINUED] omeprazole  (PRILOSEC) 20 MG capsule Take 1 capsule (20 mg total) by mouth every morning 1/2 to 1 hour before morning meal.     There is no immunization history on file for this patient.      Objective:     BP 128/88   Pulse 81   Temp 98.1 F (36.7 C) (Oral)   Ht 5' 6 (1.676 m)   Wt 251 lb 9.6 oz (114.1 kg)   LMP  (LMP Unknown)   SpO2 100%   BMI 40.61 kg/m   SpO2: 100 %  GENERAL: Obese woman, no acute distress, fully ambulatory, no conversational dyspnea. HEAD: Normocephalic, atraumatic.  EYES: Pupils equal, round, reactive to light.  No scleral icterus.  MOUTH: Dentition intact, oral mucosa moist.  No thrush. NECK: Supple. No thyromegaly. Trachea midline. No JVD.  No adenopathy. PULMONARY: Good air entry bilaterally.  No adventitious sounds. CARDIOVASCULAR: S1 and S2. Regular rate and rhythm.  No rubs, murmurs or gallops heard. ABDOMEN: Obese otherwise benign MUSCULOSKELETAL: No joint deformity, no clubbing, no edema.  NEUROLOGIC: No overt focal deficit, no gait  disturbance, speech is fluent. SKIN: Intact,warm,dry. PSYCH: Mood and behavior normal  Lab Results  Component Value Date   NITRICOXIDE 16 10/21/2023  *Low level of type II inflammation      10/21/2023    9:44 AM  Results of the Epworth flowsheet  Sitting and reading 1  Watching TV 1  Sitting, inactive in a public place (e.g. a theatre or a meeting) 0  As a passenger in a car for an hour without a break 1  Lying down to rest in the afternoon when circumstances permit 2  Sitting and talking to someone 1  Sitting quietly after a lunch without alcohol 3  In a car, while stopped for a few minutes in traffic 0  Total score 9   Chest x-ray performed 17 August 2023 showed no active cardiopulmonary disease:    Assessment & Plan:     ICD-10-CM   1. Shortness of breath  R06.02 Nitric oxide     Pulmonary function test    2. Gastroesophageal reflux disease, unspecified whether esophagitis present  K21.9 NM GASTRIC EMPTYING SOLID LIQUID BOTH W/SB TRANSIT    3. Type II diabetes mellitus with complication (HCC)  E11.8 NM GASTRIC EMPTYING SOLID LIQUID BOTH W/SB TRANSIT    4. At risk for sleep apnea  Z91.89 Home sleep test      Orders Placed This Encounter  Procedures   NM GASTRIC EMPTYING SOLID LIQUID BOTH W/SB TRANSIT    Standing Status:   Future    Expected Date:   11/04/2023    Expiration Date:   10/20/2024    Reason for Exam (SYMPTOM  OR DIAGNOSIS REQUIRED):   Severe reflux, gastroparesis suspected    If indicated for the ordered procedure, I authorize the administration of a radiopharmaceutical per Radiology protocol:   Yes    Is the patient pregnant?:   No    Preferred imaging location?:   [facility]   Nitric oxide    Pulmonary function test    Standing Status:   Future    Expiration Date:   10/20/2024    Where should this test be performed?:   Outpatient Pulmonary    What type of PFT is being ordered?:  Full PFT   Home sleep test    Standing Status:   Future     Expected Date:   11/04/2023    Expiration Date:   10/20/2024    Scheduling Instructions:     [facility]    Where should this test be performed::   [facility] - Pulmonary    Meds ordered this encounter  Medications   esomeprazole  (NEXIUM ) 40 MG capsule    Sig: Take 1 capsule (40 mg total) by mouth daily.    Dispense:  30 capsule    Refill:  2   Discussion:    Shortness of breath Intermittent shortness of breath with associated chest tightness and occasional wheezing. No significant airway inflammation detected. Differential includes gastroesophageal reflux disease and potential sleep-disordered breathing. - Order pulmonary function test to further evaluate respiratory function.  Gastroesophageal reflux disease Symptoms of throat pain and discomfort after eating, particularly with spicy foods and carbonated drinks, suggestive of gastroesophageal reflux disease. Current medication dose is suboptimal for symptom control. Potential contribution from delayed gastric emptying due to diabetes and Mounjaro  use. - Adjust medication dose for gastroesophageal reflux disease (Nexium  40 mg daily). - Order gastric emptying study to assess for delayed gastric emptying.  Suspected sleep-disordered breathing Waking with choking sensation and snoring suggests possible sleep-disordered breathing. Previous sleep study indicated slight issues but no treatment was initiated. Home sleep study preferred for comfort and accurate replication of sleep environment. - Administer sleep questionnaire: Epworth scale 9. - Order home sleep study ([facility]).  Type 2 diabetes mellitus Type 2 diabetes mellitus managed with Mounjaro . Potential impact on gastric emptying contributing to reflux symptoms.     Will see the patient in follow-up in 4 to 6 weeks time call sooner should any new problems arise.  Advised if symptoms do not improve or worsen, to please contact office for sooner follow up or seek emergency care.    I spent 60  minutes of dedicated to the care of this patient on the date of this encounter to include pre-visit review of records, face-to-face time with the patient discussing conditions above, post visit ordering of testing, clinical documentation with the electronic health record, making appropriate referrals as documented, and communicating necessary findings to members of the patients care team.   C. Leita Sanders, MD Advanced Bronchoscopy PCCM [facility]    *This note was dictated using voice recognition software/Dragon.  Despite best efforts to proofread, errors can occur which can change the meaning. Any transcriptional errors that result from this process are unintentional and may not be fully corrected at the time of dictation.

## 2023-10-21 NOTE — Patient Instructions (Signed)
 VISIT SUMMARY:  Today, we discussed your ongoing shortness of breath, throat pain, and sleep issues. We also reviewed your diabetes management and its potential impact on your symptoms.  YOUR PLAN:  -SHORTNESS OF BREATH: Your shortness of breath, along with chest tightness and occasional wheezing, may be related to acid reflux or sleep-disordered breathing. We will conduct breathing tests to better understand your respiratory function.  -GASTROESOPHAGEAL REFLUX DISEASE: Gastroesophageal reflux disease occurs when stomach acid frequently flows back into the tube connecting your mouth and stomach, causing throat pain and discomfort after eating. We will adjust your medication dose and order a gastric emptying study to see if delayed stomach emptying is contributing to your symptoms.  -SUSPECTED SLEEP-DISORDERED BREATHING: Sleep-disordered breathing includes conditions like sleep apnea, where your breathing repeatedly stops and starts during sleep. We will give you a sleep questionnaire and arrange a home sleep study to investigate this further.  -TYPE 2 DIABETES MELLITUS: Type 2 diabetes is a condition that affects the way your body processes blood sugar. You are currently managing it with Mounjaro . We will continue to monitor this as it may be affecting your stomach emptying and contributing to your reflux symptoms.  INSTRUCTIONS:  Please complete the breathing tests, gastric emptying study, and home sleep study as ordered. Fill out the sleep questionnaire provided. Follow up with us  after completing these tests to discuss the results and next steps.

## 2023-10-27 ENCOUNTER — Other Ambulatory Visit (HOSPITAL_BASED_OUTPATIENT_CLINIC_OR_DEPARTMENT_OTHER): Payer: Self-pay

## 2023-10-27 DIAGNOSIS — E119 Type 2 diabetes mellitus without complications: Secondary | ICD-10-CM | POA: Diagnosis not present

## 2023-10-27 DIAGNOSIS — R079 Chest pain, unspecified: Secondary | ICD-10-CM | POA: Diagnosis not present

## 2023-10-27 DIAGNOSIS — R0602 Shortness of breath: Secondary | ICD-10-CM | POA: Diagnosis not present

## 2023-10-27 DIAGNOSIS — R1013 Epigastric pain: Secondary | ICD-10-CM | POA: Diagnosis not present

## 2023-10-28 ENCOUNTER — Encounter: Payer: Self-pay | Admitting: Pulmonary Disease

## 2023-10-28 ENCOUNTER — Other Ambulatory Visit: Payer: Self-pay

## 2023-11-02 ENCOUNTER — Encounter

## 2023-11-02 DIAGNOSIS — Z9189 Other specified personal risk factors, not elsewhere classified: Secondary | ICD-10-CM

## 2023-11-03 DIAGNOSIS — F4322 Adjustment disorder with anxiety: Secondary | ICD-10-CM | POA: Diagnosis not present

## 2023-11-04 ENCOUNTER — Encounter (HOSPITAL_COMMUNITY)
Admission: RE | Admit: 2023-11-04 | Discharge: 2023-11-04 | Disposition: A | Discharge status: Home / Self Care | Source: Ambulatory Visit | Attending: Pulmonary Disease | Admitting: Pulmonary Disease

## 2023-11-04 DIAGNOSIS — E118 Type 2 diabetes mellitus with unspecified complications: Secondary | ICD-10-CM | POA: Diagnosis not present

## 2023-11-04 DIAGNOSIS — R079 Chest pain, unspecified: Secondary | ICD-10-CM | POA: Diagnosis not present

## 2023-11-04 DIAGNOSIS — K219 Gastro-esophageal reflux disease without esophagitis: Secondary | ICD-10-CM | POA: Diagnosis not present

## 2023-11-04 MED ORDER — TECHNETIUM TC 99M SULFUR COLLOID
2.0000 | Freq: Once | INTRAVENOUS | Status: AC | PRN
  Administered 2023-11-04: 2.1 via INTRAVENOUS

## 2023-11-11 ENCOUNTER — Ambulatory Visit: Payer: Self-pay | Admitting: Pulmonary Disease

## 2023-11-15 DIAGNOSIS — G4733 Obstructive sleep apnea (adult) (pediatric): Secondary | ICD-10-CM | POA: Diagnosis not present

## 2023-11-18 ENCOUNTER — Ambulatory Visit: Admitting: Sleep Medicine

## 2023-11-18 VITALS — BP 110/67 | HR 85 | Temp 97.5°F | Ht 66.0 in | Wt 255.0 lb

## 2023-11-18 DIAGNOSIS — I10 Essential (primary) hypertension: Secondary | ICD-10-CM

## 2023-11-18 DIAGNOSIS — Z6841 Body Mass Index (BMI) 40.0 and over, adult: Secondary | ICD-10-CM

## 2023-11-18 DIAGNOSIS — G4733 Obstructive sleep apnea (adult) (pediatric): Secondary | ICD-10-CM | POA: Diagnosis not present

## 2023-11-18 NOTE — Progress Notes (Signed)
 Name:Jessica Fritz MRN: [id number] DOB: January 15, 1976   CHIEF COMPLAINT:  HST F/U   HISTORY OF PRESENT ILLNESS: Ms. Kadow is a 48 y.o. w/ a h/o HTN, GERD, DMII and morbid obesity who presents to follow up on HST results. The patient underwent HST which revealed moderate OSA (AHI 29, O2 nadir 82%).     EPWORTH SLEEP SCORE    10/21/2023    9:44 AM  Results of the Epworth flowsheet  Sitting and reading 1  Watching TV 1  Sitting, inactive in a public place (e.g. a theatre or a meeting) 0  As a passenger in a car for an hour without a break 1  Lying down to rest in the afternoon when circumstances permit 2  Sitting and talking to someone 1  Sitting quietly after a lunch without alcohol 3  In a car, while stopped for a few minutes in traffic 0  Total score 9    PAST MEDICAL HISTORY :   has a past medical history of Anxiety, Depression, Headache(784.0), Hypertension, Migraine, Papilledema, right eye, and Vitamin D deficiency.  has a past surgical history that includes Eye surgery. Prior to Admission medications   Medication Sig Start Date End Date Taking? Authorizing Provider  Ascorbic Acid (VITAMIN C) 100 MG tablet Take 100 mg by mouth daily.    [provider]  aspirin-acetaminophen -caffeine (EXCEDRIN MIGRAINE) 250-250-65 MG tablet Take 2 tablets by mouth every 6 (six) hours as needed for headache.    [provider]  calcium carbonate (TUMS - DOSED IN MG ELEMENTAL CALCIUM) 500 MG chewable tablet Chew 2 tablets by mouth daily.   Patient taking differently: Chew 2 tablets by mouth as needed.    [provider]  Continuous Glucose Sensor (DEXCOM G7 SENSOR) MISC Inject 1 each into the skin every 10 days and use to monitor blood sugar 3 or more times a day. 06/15/23     Continuous Glucose Sensor (FREESTYLE LIBRE 3 PLUS SENSOR) MISC Use as directed to continuously monitor blood sugar. Change every 15 days. 06/15/23   Verena Mems, MD  cyclobenzaprine   (FLEXERIL ) 10 MG tablet Take 1 tablet (10 mg total) by mouth 2 (two) times daily as needed for muscle spasms. 09/10/17   Jamelle Lorrayne HERO, NP  eletriptan  (RELPAX ) 40 MG tablet Take 1 tab at onset of migraine.  May repeat in 2 hrs, if needed.  Max dose: 2 tabs/day. This is a 30 day prescription. 05/19/23   Gayland Lauraine PARAS, NP  esomeprazole  (NEXIUM ) 40 MG capsule Take 1 capsule (40 mg total) by mouth daily. 10/21/23   Tamea Dedra CROME, MD  Fremanezumab -vfrm (AJOVY ) 225 MG/1.5ML SOAJ Inject 225 mg into the skin every 30 (thirty) days. 05/19/23   Gayland Lauraine PARAS, NP  furosemide  (LASIX ) 20 MG tablet Take 1 tablet (20 mg total) by mouth daily as needed for edema. 08/07/23   Yolande Lamar BROCKS, MD  furosemide  (LASIX ) 20 MG tablet Take 1 tablet (20 mg total) by mouth daily as needed for swelling 08/20/23     meloxicam (MOBIC) 15 MG tablet TK 1 T PO QD 06/01/18   [provider]  metoprolol  tartrate (LOPRESSOR ) 100 MG tablet Take 1 tablet (100 mg total) by mouth once for 1 dose. Take 90-120 minutes prior to scan. 09/07/23 10/21/23  Shlomo Wilbert SAUNDERS, MD  Multiple Vitamin (MULTIVITAMIN) tablet Take 1 tablet by mouth daily.      [provider]  ondansetron  (ZOFRAN ) 4 MG tablet  TAKE 1 TABLET(4 MG) BY MOUTH EVERY 8 HOURS AS NEEDED FOR NAUSEA OR VOMITING Patient taking differently: every 8 (eight) hours as needed for nausea. TAKE 1 TABLET(4 MG) BY MOUTH EVERY 8 HOURS AS NEEDED FOR NAUSEA OR VOMITING 05/19/23   Gayland Lauraine PARAS, NP  telmisartan -hydrochlorothiazide  (MICARDIS  HCT) 40-12.5 MG tablet Take 1 tablet by mouth daily. 12/31/21     tirzepatide  (MOUNJARO ) 15 MG/0.5ML Pen Inject 15 mg into the skin once a week. 12/11/22     topiramate  (TOPAMAX ) 100 MG tablet Take 2 tablets (200 mg total) by mouth at bedtime. 05/19/23   Gayland Lauraine PARAS, NP  vitamin B-12 (CYANOCOBALAMIN ) 100 MCG tablet Take 100 mcg by mouth daily.    [provider]   Allergies  Allergen Reactions   Avocado Swelling    Other  reaction(s): face swelling Facial swelling     FAMILY HISTORY:  family history includes Alcohol abuse in her paternal grandfather; Diabetes in her father; Healthy in her mother. SOCIAL HISTORY:  reports that she has never smoked. She has never used smokeless tobacco. She reports current alcohol use. She reports that she does not use drugs.   Review of Systems:  Gen:  Denies  fever, sweats, chills weight loss  HEENT: Denies blurred vision, double vision, ear pain, eye pain, hearing loss, nose bleeds, sore throat Cardiac:  No dizziness, chest pain or heaviness, chest tightness,edema, No JVD Resp:   No cough, -sputum production, -shortness of breath,-wheezing, -hemoptysis,  Gi: Denies swallowing difficulty, stomach pain, nausea or vomiting, diarrhea, constipation, bowel incontinence Gu:  Denies bladder incontinence, burning urine Ext:   Denies Joint pain, stiffness or swelling Skin: Denies  skin rash, easy bruising or bleeding or hives Endoc:  Denies polyuria, polydipsia , polyphagia or weight change Psych:   Denies depression, insomnia or hallucinations  Other:  All other systems negative  VITAL SIGNS: BP 110/67   Pulse 85   Temp (!) 97.5 F (36.4 C)   Ht 5' 6 (1.676 m)   Wt 255 lb (115.7 kg)   LMP 10/28/2023   SpO2 100%   BMI 41.16 kg/m    Physical Examination:   General Appearance: No distress  EYES PERRLA, EOM intact.   NECK Supple, No JVD Pulmonary: normal breath sounds, No wheezing.  CardiovascularNormal S1,S2.  No m/r/g.   Abdomen: Benign, Soft, non-tender. Skin:   warm, no rashes, no ecchymosis  Extremities: normal, no cyanosis, clubbing. Neuro:without focal findings,  speech normal  PSYCHIATRIC: Mood, affect within normal limits.   ASSESSMENT AND PLAN  OSA Reviewed HST results with patient. Starting on APAP therapy set to 4-16 cm H2O. Discussed the consequences of untreated sleep apnea. Advised not to drive drowsy for safety of patient and others. Will  follow up in 3 months.    HTN Stable, on current management. Following with PCP.   Morbid obesity Counseled patient on diet and lifestyle modification.    Patient  satisfied with Plan of action and management. All questions answered  I spent a total of 42 minutes reviewing chart data, face-to-face evaluation with the patient, counseling and coordination of care as detailed above.    Dimitris Shanahan, M.D.  Sleep Medicine [facility]

## 2023-11-18 NOTE — Patient Instructions (Addendum)

## 2023-12-01 DIAGNOSIS — K30 Functional dyspepsia: Secondary | ICD-10-CM | POA: Diagnosis not present

## 2023-12-01 DIAGNOSIS — K219 Gastro-esophageal reflux disease without esophagitis: Secondary | ICD-10-CM | POA: Diagnosis not present

## 2023-12-01 DIAGNOSIS — G4733 Obstructive sleep apnea (adult) (pediatric): Secondary | ICD-10-CM | POA: Diagnosis not present

## 2023-12-03 DIAGNOSIS — F4322 Adjustment disorder with anxiety: Secondary | ICD-10-CM | POA: Diagnosis not present

## 2023-12-07 ENCOUNTER — Ambulatory Visit: Admitting: Pulmonary Disease

## 2023-12-07 ENCOUNTER — Ambulatory Visit (INDEPENDENT_AMBULATORY_CARE_PROVIDER_SITE_OTHER): Admitting: Pulmonary Disease

## 2023-12-07 ENCOUNTER — Encounter: Payer: Self-pay | Admitting: Pulmonary Disease

## 2023-12-07 VITALS — BP 116/66 | HR 89 | Temp 97.9°F | Ht 66.0 in | Wt 254.0 lb

## 2023-12-07 DIAGNOSIS — R0602 Shortness of breath: Secondary | ICD-10-CM

## 2023-12-07 DIAGNOSIS — Z6841 Body Mass Index (BMI) 40.0 and over, adult: Secondary | ICD-10-CM | POA: Diagnosis not present

## 2023-12-07 DIAGNOSIS — I10 Essential (primary) hypertension: Secondary | ICD-10-CM | POA: Diagnosis not present

## 2023-12-07 DIAGNOSIS — G4733 Obstructive sleep apnea (adult) (pediatric): Secondary | ICD-10-CM

## 2023-12-07 DIAGNOSIS — E118 Type 2 diabetes mellitus with unspecified complications: Secondary | ICD-10-CM

## 2023-12-07 LAB — PULMONARY FUNCTION TEST
DL/VA % pred: 155 %
DL/VA: 6.64 ml/min/mmHg/L
DLCO unc % pred: 88 %
DLCO unc: 20.21 ml/min/mmHg
FEF 25-75 Post: 2.43 L/s
FEF 25-75 Pre: 3.24 L/s
FEF2575-%Change-Post: -25 %
FEF2575-%Pred-Post: 80 %
FEF2575-%Pred-Pre: 106 %
FEV1-%Change-Post: -9 %
FEV1-%Pred-Post: 64 %
FEV1-%Pred-Pre: 71 %
FEV1-Post: 1.99 L
FEV1-Pre: 2.2 L
FEV1FVC-%Change-Post: -2 %
FEV1FVC-%Pred-Pre: 113 %
FEV6-%Change-Post: -10 %
FEV6-%Pred-Post: 56 %
FEV6-%Pred-Pre: 62 %
FEV6-Post: 2.12 L
FEV6-Pre: 2.36 L
FEV6FVC-%Pred-Post: 102 %
FEV6FVC-%Pred-Pre: 102 %
FVC-%Change-Post: -7 %
FVC-%Pred-Post: 57 %
FVC-%Pred-Pre: 61 %
FVC-Post: 2.2 L
FVC-Pre: 2.38 L
Post FEV1/FVC ratio: 90 %
Post FEV6/FVC ratio: 100 %
Pre FEV1/FVC ratio: 92 %
Pre FEV6/FVC Ratio: 100 %
RV % pred: 44 %
RV: 0.81 L
TLC % pred: 80 %
TLC: 4.31 L

## 2023-12-07 NOTE — Patient Instructions (Signed)
 Full PFT completed today ? ?

## 2023-12-07 NOTE — Patient Instructions (Signed)
 VISIT SUMMARY:  Today, we discussed your ongoing issues with shortness of breath and your recent diagnosis of obstructive sleep apnea. We also reviewed your general health maintenance, including your decision to decline the flu shot this year.  YOUR PLAN:  -OBSTRUCTIVE SLEEP APNEA: Obstructive sleep apnea is a condition where your breathing stops and starts repeatedly during sleep. You should start using your CPAP machine every night to help improve your breathing, reduce fatigue, and enhance your overall well-being. Please monitor for any improvement in your daytime symptoms such as fatigue and grogginess.  -SHORTNESS OF BREATH: Your shortness of breath has improved but still occurs intermittently. This is likely related to your weight and reduced activity level. Increasing your physical activity can help improve these symptoms. Please monitor for any worsening of symptoms.  -GENERAL HEALTH MAINTENANCE: You have declined the flu vaccination this year due to a previous adverse reaction. Please continue to monitor your overall health and consider other preventive measures to stay healthy.  INSTRUCTIONS:  Please start using your CPAP machine nightly and monitor for any improvement in your symptoms. Increase your physical activity to help with your shortness of breath. If your symptoms worsen or if you have any concerns, please schedule a follow-up appointment.

## 2023-12-07 NOTE — Progress Notes (Signed)
 Full PFT completed today ? ?

## 2023-12-07 NOTE — Progress Notes (Signed)
 Subjective:    Patient ID: Jessica Fritz, female    DOB: [birth year], 48 y.o.   MRN: [id number]  Patient Care Team: Espinoza, Alejandra, DO as PCP - General (Family Medicine) Glendia Simmonds, OD as Referring Physician [facility])  Chief Complaint  Patient presents with   Shortness of Breath    Shortness of breath on exertion and occasional at rest.     BACKGROUND/INTERVAL:Patient is a 48 year old lifelong never smoker who presents for follow-up of shortness of breath on exertion.  Initially seen here on 21 October 2023.  She also has obstructive sleep apnea and has been set up for follow-up with our sleep specialist, Dr. Jess.  She had PFTs today.  HPI Discussed the use of AI scribe software for clinical note transcription with the patient, who gave verbal consent to proceed.  History of Present Illness   Jessica Fritz is a 48 year old female who presents for follow-up on shortness of breath.  Shortness of breath has improved since the last visit, occurring less frequently and with reduced severity. However, she still experiences intermittent episodes of difficulty breathing.  She recently acquired a CPAP machine following a sleep study that revealed she stopped breathing 29 times per hour. She has not yet started using the CPAP machine.  She feels fatigued and groggy.  She has not received a flu shot this year, citing a previous adverse reaction as the reason for her decision.     DATA 08/25/2023 echocardiogram: LVEF 60 to 65%, LV diastolic parameters normal.  RV function is normal.  RV size is normal.  No valvular abnormalities. 09/14/2023 coronary CT: Coronary calcium score of 0.  No significant coronary disease noted.  Visualized portions of the thorax show no suspicious appearing pulmonary nodules or masses.  No airspace disease. 12/07/2023 PFTs: FEV1 2.20 L or 71% predicted, FVC 2.38 L or 61% predicted, FEV1/FVC 92%.  Lung volumes minimally reduced diffusion capacity normal  though patient was not able to reproduce values well due to claustrophobia.  Overall mildly reduced volumes suggestive of restriction this is likely on the basis of obesity.   Review of Systems A 10 point review of systems was performed and it is as noted above otherwise negative.   Patient Active Problem List   Diagnosis Date Noted   Back pain 05/07/2021   Diabetes mellitus without complication (HCC) 05/07/2021   Edema of lower extremity 05/07/2021   Essential hypertension 05/07/2021   Migraine 05/07/2021   Neck pain 05/07/2021   Oligomenorrhea 05/07/2021   Vitamin D deficiency 05/07/2021   Excessive sleepiness 12/07/2016   Chronic migraine w/o aura w/o status migrainosus, not intractable 10/15/2016   Major depressive disorder, recurrent episode, moderate (HCC) 12/26/2013   MDD (major depressive disorder), recurrent episode, moderate (HCC) 08/11/2013   GAD (generalized anxiety disorder) 08/11/2013    Social History   Tobacco Use   Smoking status: Never   Smokeless tobacco: Never  Substance Use Topics   Alcohol use: Yes    Comment: occ    Allergies  Allergen Reactions   Avocado Swelling    Other reaction(s): face swelling Facial swelling     Current Meds  Medication Sig   Ascorbic Acid (VITAMIN C) 100 MG tablet Take 100 mg by mouth daily.   aspirin-acetaminophen -caffeine (EXCEDRIN MIGRAINE) 250-250-65 MG tablet Take 2 tablets by mouth every 6 (six) hours as needed for headache.   calcium carbonate (TUMS - DOSED IN MG ELEMENTAL CALCIUM) 500 MG chewable tablet Chew 2 tablets by  mouth daily.   (Patient taking differently: Chew 2 tablets by mouth as needed.)   Continuous Glucose Sensor (DEXCOM G7 SENSOR) MISC Inject 1 each into the skin every 10 days and use to monitor blood sugar 3 or more times a day.   Continuous Glucose Sensor (FREESTYLE LIBRE 3 PLUS SENSOR) MISC Use as directed to continuously monitor blood sugar. Change every 15 days.   cyclobenzaprine  (FLEXERIL ) 10  MG tablet Take 1 tablet (10 mg total) by mouth 2 (two) times daily as needed for muscle spasms.   eletriptan  (RELPAX ) 40 MG tablet Take 1 tab at onset of migraine.  May repeat in 2 hrs, if needed.  Max dose: 2 tabs/day. This is a 30 day prescription.   esomeprazole  (NEXIUM ) 40 MG capsule Take 1 capsule (40 mg total) by mouth daily.   Fremanezumab -vfrm (AJOVY ) 225 MG/1.5ML SOAJ Inject 225 mg into the skin every 30 (thirty) days.   furosemide  (LASIX ) 20 MG tablet Take 1 tablet (20 mg total) by mouth daily as needed for edema.   furosemide  (LASIX ) 20 MG tablet Take 1 tablet (20 mg total) by mouth daily as needed for swelling   meloxicam (MOBIC) 15 MG tablet TK 1 T PO QD   Multiple Vitamin (MULTIVITAMIN) tablet Take 1 tablet by mouth daily.     ondansetron  (ZOFRAN ) 4 MG tablet TAKE 1 TABLET(4 MG) BY MOUTH EVERY 8 HOURS AS NEEDED FOR NAUSEA OR VOMITING (Patient taking differently: every 8 (eight) hours as needed for nausea. TAKE 1 TABLET(4 MG) BY MOUTH EVERY 8 HOURS AS NEEDED FOR NAUSEA OR VOMITING)   telmisartan -hydrochlorothiazide  (MICARDIS  HCT) 40-12.5 MG tablet Take 1 tablet by mouth daily.   tirzepatide  (MOUNJARO ) 15 MG/0.5ML Pen Inject 15 mg into the skin once a week.   topiramate  (TOPAMAX ) 100 MG tablet Take 2 tablets (200 mg total) by mouth at bedtime.   vitamin B-12 (CYANOCOBALAMIN ) 100 MCG tablet Take 100 mcg by mouth daily.     There is no immunization history on file for this patient.      Objective:     BP 116/66   Pulse 89   Temp 97.9 F (36.6 C) (Temporal)   Ht 5' 6 (1.676 m)   Wt 254 lb (115.2 kg)   LMP  (LMP Unknown)   SpO2 99%   BMI 41.00 kg/m   SpO2: 99 %  GENERAL: Obese woman, no acute distress, fully ambulatory, no conversational dyspnea. HEAD: Normocephalic, atraumatic.  EYES: Pupils equal, round, reactive to light.  No scleral icterus.  MOUTH: Dentition intact, oral mucosa moist.  No thrush. NECK: Supple. No thyromegaly. Trachea midline. No JVD.  No  adenopathy. PULMONARY: Good air entry bilaterally.  No adventitious sounds. CARDIOVASCULAR: S1 and S2. Regular rate and rhythm.  No rubs, murmurs or gallops heard. ABDOMEN: Obese otherwise benign MUSCULOSKELETAL: No joint deformity, no clubbing, no edema.  NEUROLOGIC: No overt focal deficit, no gait disturbance, speech is fluent. SKIN: Intact,warm,dry. PSYCH: Mood and behavior normal  Recent Results (from the past 2160 hours)  Nitric oxide      Status: None   Collection Time: 10/21/23  9:24 AM  Result Value Ref Range   Nitric Oxide  16   Pulmonary function test     Status: None   Collection Time: 12/07/23  8:59 AM  Result Value Ref Range   FVC-Pre 2.38 L   FVC-%Pred-Pre 61 %   FVC-Post 2.20 L   FVC-%Pred-Post 57 %   FVC-%Change-Post -7 %   FEV1-Pre 2.20 L  FEV1-%Pred-Pre 71 %   FEV1-Post 1.99 L   FEV1-%Pred-Post 64 %   FEV1-%Change-Post -9 %   FEV6-Pre 2.36 L   FEV6-%Pred-Pre 62 %   FEV6-Post 2.12 L   FEV6-%Pred-Post 56 %   FEV6-%Change-Post -10 %   Pre FEV1/FVC ratio 92 %   FEV1FVC-%Pred-Pre 113 %   Post FEV1/FVC ratio 90 %   FEV1FVC-%Change-Post -2 %   Pre FEV6/FVC Ratio 100 %   FEV6FVC-%Pred-Pre 102 %   Post FEV6/FVC ratio 100 %   FEV6FVC-%Pred-Post 102 %   FEF 25-75 Pre 3.24 L/sec   FEF2575-%Pred-Pre 106 %   FEF 25-75 Post 2.43 L/sec   FEF2575-%Pred-Post 80 %   FEF2575-%Change-Post -25 %   RV 0.81 L   RV % pred 44 %   TLC 4.31 L   TLC % pred 80 %   DLCO unc 20.21 ml/min/mmHg   DLCO unc % pred 88 %   DL/VA 3.35 ml/min/mmHg/L   DL/VA % pred 844 %  *Discussed PFT results with the patient       Assessment & Plan:     ICD-10-CM   1. Shortness of breath  R06.02     2. OSA (obstructive sleep apnea)  G47.33     3. Morbid obesity (HCC)  E66.01     4. Type II diabetes mellitus with complication (HCC)  E11.8      Discussion:    Obstructive sleep apnea Obstructive sleep apnea diagnosed with sleep study showing 29 apneic episodes per hour. - Use CPAP  machine nightly to improve daytime breathing, reduce fatigue, and enhance overall well-being. - Monitor for improvement in daytime symptoms such as fatigue and grogginess. - Patient has been set up with our sleep physician, Dr. Jess.  Shortness of breath Shortness of breath has improved but still occurs intermittently, likely related to weight and reduced activity level. - Increase physical activity to improve symptoms. - Monitor for any worsening of symptoms.  General Health Maintenance Declined flu vaccination due to previous adverse reaction.    Will see the patient in follow-up on an as-needed basis.  She appears to be improving with lifestyle modification.  Hopefully this will continue to be the case.   Advised if symptoms do not improve or worsen, to please contact office for sooner follow up or seek emergency care.    I spent 30 minutes of dedicated to the care of this patient on the date of this encounter to include pre-visit review of records, face-to-face time with the patient discussing conditions above, post visit ordering of testing, clinical documentation with the electronic health record, making appropriate referrals as documented, and communicating necessary findings to members of the patients care team.     C. Leita Sanders, MD Advanced Bronchoscopy PCCM [facility]    *This note was generated using voice recognition software/Dragon and/or AI transcription program.  Despite best efforts to proofread, errors can occur which can change the meaning. Any transcriptional errors that result from this process are unintentional and may not be fully corrected at the time of dictation.

## 2023-12-11 ENCOUNTER — Other Ambulatory Visit (HOSPITAL_BASED_OUTPATIENT_CLINIC_OR_DEPARTMENT_OTHER): Payer: Self-pay

## 2023-12-13 ENCOUNTER — Encounter: Payer: Self-pay | Admitting: Pulmonary Disease

## 2023-12-20 ENCOUNTER — Other Ambulatory Visit (HOSPITAL_BASED_OUTPATIENT_CLINIC_OR_DEPARTMENT_OTHER): Payer: Self-pay

## 2023-12-22 DIAGNOSIS — I10 Essential (primary) hypertension: Secondary | ICD-10-CM | POA: Diagnosis not present

## 2023-12-23 DIAGNOSIS — F4322 Adjustment disorder with anxiety: Secondary | ICD-10-CM | POA: Diagnosis not present

## 2024-01-07 DIAGNOSIS — G4733 Obstructive sleep apnea (adult) (pediatric): Secondary | ICD-10-CM | POA: Diagnosis not present

## 2024-01-07 DIAGNOSIS — I10 Essential (primary) hypertension: Secondary | ICD-10-CM | POA: Diagnosis not present

## 2024-01-21 ENCOUNTER — Other Ambulatory Visit (HOSPITAL_COMMUNITY): Payer: Self-pay

## 2024-01-21 ENCOUNTER — Telehealth: Payer: Self-pay

## 2024-01-21 NOTE — Telephone Encounter (Signed)
 Pharmacy Patient Advocate Encounter   Received notification from Fax that prior authorization for Ajovy  is required/requested.   Insurance verification completed.   The patient is insured through [employer].   Per test claim: PA required; PA started via CoverMyMeds. KEY [id number] . Waiting for clinical questions to populate.

## 2024-01-24 ENCOUNTER — Other Ambulatory Visit (HOSPITAL_COMMUNITY): Payer: Self-pay

## 2024-01-24 NOTE — Telephone Encounter (Signed)
 Pharmacy Patient Advocate Encounter  Received notification from [facility] that Prior Authorization for AJOVY  (fremanezumab -vfrm) injection 225MG /1.5ML auto-injectors  has been APPROVED from 01/24/2024 to 01/23/2025. Ran test claim, Copay is $0. This test claim was processed through [facility]- copay amounts may vary at other pharmacies due to pharmacy/plan contracts, or as the patient moves through the different stages of their insurance plan.   PA #/Case ID/Reference #: [id number]

## 2024-01-26 ENCOUNTER — Other Ambulatory Visit (HOSPITAL_BASED_OUTPATIENT_CLINIC_OR_DEPARTMENT_OTHER): Payer: Self-pay

## 2024-01-26 DIAGNOSIS — M5451 Vertebrogenic low back pain: Secondary | ICD-10-CM | POA: Diagnosis not present

## 2024-01-26 DIAGNOSIS — S134XXA Sprain of ligaments of cervical spine, initial encounter: Secondary | ICD-10-CM | POA: Diagnosis not present

## 2024-01-26 DIAGNOSIS — H8111 Benign paroxysmal vertigo, right ear: Secondary | ICD-10-CM | POA: Diagnosis not present

## 2024-01-26 MED ORDER — CYCLOBENZAPRINE HCL 10 MG PO TABS
10.0000 mg | ORAL_TABLET | Freq: Two times a day (BID) | ORAL | 1 refills | Status: AC
  Filled 2024-01-26: qty 60, 30d supply, fill #0
  Filled 2024-02-26: qty 60, 30d supply, fill #1

## 2024-01-29 ENCOUNTER — Other Ambulatory Visit (HOSPITAL_BASED_OUTPATIENT_CLINIC_OR_DEPARTMENT_OTHER): Payer: Self-pay | Admitting: Family Medicine

## 2024-01-29 ENCOUNTER — Other Ambulatory Visit (HOSPITAL_BASED_OUTPATIENT_CLINIC_OR_DEPARTMENT_OTHER): Payer: Self-pay

## 2024-02-01 ENCOUNTER — Ambulatory Visit: Admitting: Podiatry

## 2024-02-02 ENCOUNTER — Other Ambulatory Visit (HOSPITAL_BASED_OUTPATIENT_CLINIC_OR_DEPARTMENT_OTHER): Payer: Self-pay

## 2024-02-04 ENCOUNTER — Ambulatory Visit: Admitting: Sleep Medicine

## 2024-02-04 ENCOUNTER — Encounter: Payer: Self-pay | Admitting: Sleep Medicine

## 2024-02-04 ENCOUNTER — Other Ambulatory Visit (HOSPITAL_BASED_OUTPATIENT_CLINIC_OR_DEPARTMENT_OTHER): Payer: Self-pay

## 2024-02-04 VITALS — BP 130/80 | HR 75 | Temp 98.0°F | Ht 66.0 in | Wt 265.2 lb

## 2024-02-04 DIAGNOSIS — Z713 Dietary counseling and surveillance: Secondary | ICD-10-CM | POA: Diagnosis not present

## 2024-02-04 DIAGNOSIS — G4733 Obstructive sleep apnea (adult) (pediatric): Secondary | ICD-10-CM

## 2024-02-04 DIAGNOSIS — I10 Essential (primary) hypertension: Secondary | ICD-10-CM | POA: Diagnosis not present

## 2024-02-04 DIAGNOSIS — Z6841 Body Mass Index (BMI) 40.0 and over, adult: Secondary | ICD-10-CM | POA: Diagnosis not present

## 2024-02-04 MED ORDER — MOUNJARO 15 MG/0.5ML ~~LOC~~ SOAJ
15.0000 mg | SUBCUTANEOUS | 3 refills | Status: AC
  Filled 2024-02-04: qty 2, 28d supply, fill #0
  Filled 2024-03-04: qty 2, 28d supply, fill #1

## 2024-02-04 NOTE — Patient Instructions (Addendum)

## 2024-02-04 NOTE — Progress Notes (Signed)
 "      Name:Jessica Fritz MRN: [id number] DOB: 07/11/1975   CHIEF COMPLAINT:  CPAP F/U   HISTORY OF PRESENT ILLNESS: Ms. Jessica Fritz is a 47 y.o. w/ a h/o OSA, HTN, GERD, DMII and morbid obesity who presents for CPAP follow up visit. Reports using the Airfit N30 nasal mask, which is comfortable. Reports feeling more refreshed upon awakening with CPAP therapy.    EPWORTH SLEEP SCORE    10/21/2023    9:44 AM  Results of the Epworth flowsheet  Sitting and reading 1  Watching TV 1  Sitting, inactive in a public place (e.g. a theatre or a meeting) 0  As a passenger in a car for an hour without a break 1  Lying down to rest in the afternoon when circumstances permit 2  Sitting and talking to someone 1  Sitting quietly after a lunch without alcohol 3  In a car, while stopped for a few minutes in traffic 0  Total score 9    PAST MEDICAL HISTORY :   has a past medical history of Anxiety, Depression, Diabetes mellitus without complication (HCC), Headache(784.0), Hypertension, Migraine, Papilledema, right eye, Vertigo, and Vitamin D deficiency.  has a past surgical history that includes Eye surgery. Prior to Admission medications   Medication Sig Start Date End Date Taking? Authorizing Provider  Ascorbic Acid (VITAMIN C) 100 MG tablet Take 100 mg by mouth daily.    [provider]  aspirin-acetaminophen -caffeine (EXCEDRIN MIGRAINE) 250-250-65 MG tablet Take 2 tablets by mouth every 6 (six) hours as needed for headache.    [provider]  calcium carbonate (TUMS - DOSED IN MG ELEMENTAL CALCIUM) 500 MG chewable tablet Chew 2 tablets by mouth daily.   Patient taking differently: Chew 2 tablets by mouth as needed.    [provider]  Continuous Glucose Sensor (DEXCOM G7 SENSOR) MISC Inject 1 each into the skin every 10 days and use to monitor blood sugar 3 or more times a day. 06/15/23     Continuous Glucose Sensor (FREESTYLE LIBRE 3 PLUS SENSOR) MISC Use as directed  to continuously monitor blood sugar. Change every 15 days. 06/15/23   Jessica Mems, MD  cyclobenzaprine  (FLEXERIL ) 10 MG tablet Take 1 tablet (10 mg total) by mouth 2 (two) times daily as needed for muscle spasms. 09/10/17   Jessica Lorrayne HERO, NP  eletriptan  (RELPAX ) 40 MG tablet Take 1 tab at onset of migraine.  May repeat in 2 hrs, if needed.  Max dose: 2 tabs/day. This is a 30 day prescription. 05/19/23   Jessica Lauraine PARAS, NP  esomeprazole  (NEXIUM ) 40 MG capsule Take 1 capsule (40 mg total) by mouth daily. 10/21/23   Jessica Dedra CROME, MD  Fremanezumab -vfrm (AJOVY ) 225 MG/1.5ML SOAJ Inject 225 mg into the skin every 30 (thirty) days. 05/19/23   Jessica Lauraine PARAS, NP  furosemide  (LASIX ) 20 MG tablet Take 1 tablet (20 mg total) by mouth daily as needed for edema. 08/07/23   Jessica Lamar BROCKS, MD  furosemide  (LASIX ) 20 MG tablet Take 1 tablet (20 mg total) by mouth daily as needed for swelling 08/20/23     meloxicam (MOBIC) 15 MG tablet TK 1 T PO QD 06/01/18   [provider]  metoprolol  tartrate (LOPRESSOR ) 100 MG tablet Take 1 tablet (100 mg total) by mouth once for 1 dose. Take 90-120 minutes prior to scan. 09/07/23 10/21/23  Jessica Wilbert SAUNDERS, MD  Multiple Vitamin (MULTIVITAMIN) tablet Take 1 tablet by mouth daily.  [provider]  ondansetron  (ZOFRAN ) 4 MG tablet TAKE 1 TABLET(4 MG) BY MOUTH EVERY 8 HOURS AS NEEDED FOR NAUSEA OR VOMITING Patient taking differently: every 8 (eight) hours as needed for nausea. TAKE 1 TABLET(4 MG) BY MOUTH EVERY 8 HOURS AS NEEDED FOR NAUSEA OR VOMITING 05/19/23   Jessica Lauraine PARAS, NP  telmisartan -hydrochlorothiazide  (MICARDIS  HCT) 40-12.5 MG tablet Take 1 tablet by mouth daily. 12/31/21     tirzepatide  (MOUNJARO ) 15 MG/0.5ML Pen Inject 15 mg into the skin once a week. 12/11/22     topiramate  (TOPAMAX ) 100 MG tablet Take 2 tablets (200 mg total) by mouth at bedtime. 05/19/23   Jessica Lauraine PARAS, NP  vitamin B-12 (CYANOCOBALAMIN ) 100 MCG tablet Take 100 mcg by mouth  daily.    [provider]   Allergies  Allergen Reactions   Avocado Swelling    Other reaction(s): face swelling Facial swelling     FAMILY HISTORY:  family history includes Alcohol abuse in her paternal grandfather; Clotting disorder in her mother; Diabetes in her father; Hypertension in her father and mother. SOCIAL HISTORY:  reports that she has never smoked. She has never used smokeless tobacco. She reports current alcohol use. She reports that she does not use drugs.   Review of Systems:  Gen:  Denies  fever, sweats, chills weight loss  HEENT: Denies blurred vision, double vision, ear pain, eye pain, hearing loss, nose bleeds, sore throat Cardiac:  No dizziness, chest pain or heaviness, chest tightness,edema, No JVD Resp:   No cough, -sputum production, -shortness of breath,-wheezing, -hemoptysis,  Gi: Denies swallowing difficulty, stomach pain, nausea or vomiting, diarrhea, constipation, bowel incontinence Gu:  Denies bladder incontinence, burning urine Ext:   Denies Joint pain, stiffness or swelling Skin: Denies  skin rash, easy bruising or bleeding or hives Endoc:  Denies polyuria, polydipsia , polyphagia or weight change Psych:   Denies depression, insomnia or hallucinations  Other:  All other systems negative  VITAL SIGNS: BP 130/80   Pulse 75   Temp 98 F (36.7 C)   Ht 5' 6 (1.676 m)   Wt 265 lb 3.2 oz (120.3 kg)   LMP  (LMP Unknown)   SpO2 99%   BMI 42.80 kg/m    Physical Examination:   General Appearance: No distress  EYES PERRLA, EOM intact.   NECK Supple, No JVD Pulmonary: normal breath sounds, No wheezing.  CardiovascularNormal S1,S2.  No m/r/g.   Abdomen: Benign, Soft, non-tender. Skin:   warm, no rashes, no ecchymosis  Extremities: normal, no cyanosis, clubbing. Neuro:without focal findings,  speech normal  PSYCHIATRIC: Mood, affect within normal limits.   ASSESSMENT AND PLAN  OSA Patient is using and benefiting from CPAP  therapy. Discussed the consequences of untreated sleep apnea. Advised not to drive drowsy for safety of patient and others. Will follow up in 3 months.    HTN Stable, on current management. Following with PCP.   Morbid obesity Counseled patient on diet and lifestyle modification.    Patient  satisfied with Plan of action and management. All questions answered  I spent a total of 25 minutes reviewing chart data, face-to-face evaluation with the patient, counseling and coordination of care as detailed above.    Teaghan Formica, M.D.  Sleep Medicine [facility]        "

## 2024-02-05 ENCOUNTER — Other Ambulatory Visit (HOSPITAL_BASED_OUTPATIENT_CLINIC_OR_DEPARTMENT_OTHER): Payer: Self-pay

## 2024-02-08 ENCOUNTER — Other Ambulatory Visit (HOSPITAL_BASED_OUTPATIENT_CLINIC_OR_DEPARTMENT_OTHER): Payer: Self-pay

## 2024-02-08 ENCOUNTER — Other Ambulatory Visit: Payer: Self-pay

## 2024-02-08 ENCOUNTER — Ambulatory Visit: Admitting: Podiatry

## 2024-02-08 VITALS — Ht 66.0 in | Wt 265.2 lb

## 2024-02-08 DIAGNOSIS — M722 Plantar fascial fibromatosis: Secondary | ICD-10-CM | POA: Diagnosis not present

## 2024-02-08 DIAGNOSIS — L600 Ingrowing nail: Secondary | ICD-10-CM | POA: Diagnosis not present

## 2024-02-08 MED ORDER — NEOMYCIN-POLYMYXIN-HC 3.5-10000-1 OT SUSP
OTIC | 0 refills | Status: AC
  Filled 2024-02-08: qty 10, 30d supply, fill #0

## 2024-02-08 NOTE — Patient Instructions (Addendum)
 VISIT SUMMARY: During today's visit, we addressed your left hallux ingrown toenail and foot pain. We discussed your symptoms, including the discomfort from the ingrown toenail and the intermittent plantar pain. We also reviewed your concerns about diabetes-related complications. A procedure was performed to treat the ingrown toenail, and we provided recommendations for managing your plantar fasciitis.  YOUR PLAN: -LEFT HALLUX INGROWN TOENAIL: An ingrown toenail occurs when the edge of the toenail grows into the skin, causing pain and sometimes infection. We performed a partial nail avulsion with chemical matrixectomy to remove the affected part of the nail and prevent recurrence. You were given topical antibiotic and corticosteroid drops to prevent infection and reduce inflammation. Follow the instructions for postoperative care, including soaking your foot and elevating it to reduce discomfort. You can resume normal activities and wear shoes after the procedure, but avoid pedicures for about a month and ensure proper salon hygiene thereafter.  -PLANTAR FASCIITIS WITH PES PLANUS: Plantar fasciitis is inflammation of the tissue along the bottom of your foot, often associated with flat feet (pes planus). We recommend stretching exercises for your plantar fascia and using house shoes with good arch support, such as the [employer]OFOS brand, to help alleviate your symptoms.  INSTRUCTIONS: Please follow the postoperative care instructions for your ingrown toenail procedure, including soaking your foot and elevating it to reduce discomfort. Use the prescribed topical antibiotic and corticosteroid drops as directed. Avoid pedicures for about a month and ensure proper salon hygiene thereafter. For your plantar fasciitis, perform the recommended stretching exercises and use house shoes with good arch support. If you have any concerns or if your symptoms worsen, please contact our  office.                      Contains text generated by [employer].                                 Contains text generated by [employer].   Plantar Fasciitis (Heel Spur Syndrome) with Rehab The plantar fascia is a fibrous, ligament-like, soft-tissue structure that spans the bottom of the foot. Plantar fasciitis is a condition that causes pain in the foot due to inflammation of the tissue. SYMPTOMS  Pain and tenderness on the underneath side of the foot. Pain that worsens with standing or walking. CAUSES  Plantar fasciitis is caused by irritation and injury to the plantar fascia on the underneath side of the foot. Common mechanisms of injury include: Direct trauma to bottom of the foot. Damage to a small nerve that runs under the foot where the main fascia attaches to the heel bone. Stress placed on the plantar fascia due to bone spurs. RISK INCREASES WITH:  Activities that place stress on the plantar fascia (running, jumping, pivoting, or cutting). Poor strength and flexibility. Improperly fitted shoes. Tight calf muscles. Flat feet. Failure to warm-up properly before activity. Obesity. PREVENTION Warm up and stretch properly before activity. Allow for adequate recovery between workouts. Maintain physical fitness: Strength, flexibility, and endurance. Cardiovascular fitness. Maintain a health body weight. Avoid stress on the plantar fascia. Wear properly fitted shoes, including arch supports for individuals who have flat feet.  PROGNOSIS  If treated properly, then the symptoms of plantar fasciitis usually resolve without surgery. However, occasionally surgery is necessary.  RELATED COMPLICATIONS  Recurrent symptoms that may result in a chronic condition. Problems of the lower back that are caused by compensating for  the injury, such as limping. Pain or weakness of the foot during push-off following surgery. Chronic inflammation,  scarring, and partial or complete fascia tear, occurring more often from repeated injections.  TREATMENT  Treatment initially involves the use of ice and medication to help reduce pain and inflammation. The use of strengthening and stretching exercises may help reduce pain with activity, especially stretches of the Achilles tendon. These exercises may be performed at home or with a therapist. Your caregiver may recommend that you use heel cups of arch supports to help reduce stress on the plantar fascia. Occasionally, corticosteroid injections are given to reduce inflammation. If symptoms persist for greater than 6 months despite non-surgical (conservative), then surgery may be recommended.   MEDICATION  If pain medication is necessary, then nonsteroidal anti-inflammatory medications, such as aspirin and ibuprofen , or other minor pain relievers, such as acetaminophen , are often recommended. Do not take pain medication within 7 days before surgery. Prescription pain relievers may be given if deemed necessary by your caregiver. Use only as directed and only as much as you need. Corticosteroid injections may be given by your caregiver. These injections should be reserved for the most serious cases, because they may only be given a certain number of times.  HEAT AND COLD Cold treatment (icing) relieves pain and reduces inflammation. Cold treatment should be applied for 10 to 15 minutes every 2 to 3 hours for inflammation and pain and immediately after any activity that aggravates your symptoms. Use ice packs or massage the area with a piece of ice (ice massage). Heat treatment may be used prior to performing the stretching and strengthening activities prescribed by your caregiver, physical therapist, or athletic trainer. Use a heat pack or soak the injury in warm water.  SEEK IMMEDIATE MEDICAL CARE IF: Treatment seems to offer no benefit, or the condition worsens. Any medications produce adverse side  effects.  EXERCISES- RANGE OF MOTION (ROM) AND STRETCHING EXERCISES - Plantar Fasciitis (Heel Spur Syndrome) These exercises may help you when beginning to rehabilitate your injury. Your symptoms may resolve with or without further involvement from your physician, physical therapist or athletic trainer. While completing these exercises, remember:  Restoring tissue flexibility helps normal motion to return to the joints. This allows healthier, less painful movement and activity. An effective stretch should be held for at least 30 seconds. A stretch should never be painful. You should only feel a gentle lengthening or release in the stretched tissue.  RANGE OF MOTION - Toe Extension, Flexion Sit with your right / left leg crossed over your opposite knee. Grasp your toes and gently pull them back toward the top of your foot. You should feel a stretch on the bottom of your toes and/or foot. Hold this stretch for 10 seconds. Now, gently pull your toes toward the bottom of your foot. You should feel a stretch on the top of your toes and or foot. Hold this stretch for 10 seconds. Repeat  times. Complete this stretch 3 times per day.   RANGE OF MOTION - Ankle Dorsiflexion, Active Assisted Remove shoes and sit on a chair that is preferably not on a carpeted surface. Place right / left foot under knee. Extend your opposite leg for support. Keeping your heel down, slide your right / left foot back toward the chair until you feel a stretch at your ankle or calf. If you do not feel a stretch, slide your bottom forward to the edge of the chair, while still keeping your heel  down. Hold this stretch for 10 seconds. Repeat 3 times. Complete this stretch 2 times per day.   STRETCH  Gastroc, Standing Place hands on wall. Extend right / left leg, keeping the front knee somewhat bent. Slightly point your toes inward on your back foot. Keeping your right / left heel on the floor and your knee straight, shift  your weight toward the wall, not allowing your back to arch. You should feel a gentle stretch in the right / left calf. Hold this position for 10 seconds. Repeat 3 times. Complete this stretch 2 times per day.  STRETCH  Soleus, Standing Place hands on wall. Extend right / left leg, keeping the other knee somewhat bent. Slightly point your toes inward on your back foot. Keep your right / left heel on the floor, bend your back knee, and slightly shift your weight over the back leg so that you feel a gentle stretch deep in your back calf. Hold this position for 10 seconds. Repeat 3 times. Complete this stretch 2 times per day.  STRETCH  Gastrocsoleus, Standing  Note: This exercise can place a lot of stress on your foot and ankle. Please complete this exercise only if specifically instructed by your caregiver.  Place the ball of your right / left foot on a step, keeping your other foot firmly on the same step. Hold on to the wall or a rail for balance. Slowly lift your other foot, allowing your body weight to press your heel down over the edge of the step. You should feel a stretch in your right / left calf. Hold this position for 10 seconds. Repeat this exercise with a slight bend in your right / left knee. Repeat 3 times. Complete this stretch 2 times per day.   STRENGTHENING EXERCISES - Plantar Fasciitis (Heel Spur Syndrome)  These exercises may help you when beginning to rehabilitate your injury. They may resolve your symptoms with or without further involvement from your physician, physical therapist or athletic trainer. While completing these exercises, remember:  Muscles can gain both the endurance and the strength needed for everyday activities through controlled exercises. Complete these exercises as instructed by your physician, physical therapist or athletic trainer. Progress the resistance and repetitions only as guided.  STRENGTH - Towel Curls Sit in a chair positioned on a  non-carpeted surface. Place your foot on a towel, keeping your heel on the floor. Pull the towel toward your heel by only curling your toes. Keep your heel on the floor. Repeat 3 times. Complete this exercise 2 times per day.  STRENGTH - Ankle Inversion Secure one end of a rubber exercise band/tubing to a fixed object (table, pole). Loop the other end around your foot just before your toes. Place your fists between your knees. This will focus your strengthening at your ankle. Slowly, pull your big toe up and in, making sure the band/tubing is positioned to resist the entire motion. Hold this position for 10 seconds. Have your muscles resist the band/tubing as it slowly pulls your foot back to the starting position. Repeat 3 times. Complete this exercises 2 times per day.  Document Released: 02/09/2005 Document Revised: 05/04/2011 Document Reviewed: 05/24/2008 [facility] Patient Information 2014 [place], [place].     Soak Instructions    THE DAY AFTER THE PROCEDURE  Place 1/4 cup of epsom salts (or betadine, or white vinegar) in a quart of warm tap water.  Submerge your foot or feet with outer bandage intact for the initial soak; this  will allow the bandage to become moist and wet for easy lift off.  Once you remove your bandage, continue to soak in the solution for 20 minutes.  This soak should be done twice a day.  Next, remove your foot or feet from solution, blot dry the affected area and apply 2-3 of the Cortisporin  drops if they were prescribed for you.  If you did not receive a prescription use regular Neosporin or antibiotic ointment.  Then cover with a regular Band-Aid.  Do this for at least 2 weeks.  Longer if you are still having drainage redness or irritation  IF YOUR SKIN BECOMES IRRITATED WHILE USING THESE INSTRUCTIONS, IT IS OKAY TO SWITCH TO  WHITE VINEGAR AND WATER. Or you may use antibacterial soap and water to keep the toe clean  Monitor for any signs/symptoms of infection.  Call the office immediately if any occur or go directly to the emergency room. Call with any questions/concerns.    Long Term Care Instructions-Post Nail Surgery  You have had your ingrown toenail and root treated with a chemical.  This chemical causes a burn that will drain and ooze like a blister.  This can drain for 6-8 weeks or longer.  It is important to keep this area clean, covered, and follow the soaking instructions dispensed at the time of your surgery.  This area will eventually dry and form a scab.  Once the scab forms you no longer need to soak or apply a dressing.  If at any time you experience an increase in pain, redness, swelling, or drainage, you should contact the office as soon as possible.

## 2024-02-09 NOTE — Progress Notes (Signed)
 Subjective:  Patient ID: Jessica Fritz, female    DOB: 11/15/1975,  MRN: [id number]  Chief Complaint  Patient presents with   Diabetes    Rm 3 Patient is here for possible ingrown toe nail of the left and right hallux ( medial borders) and a sharp pain in the bottom of feet ( intermittent, present for 2-3 months).    Discussed the use of AI scribe software for clinical note transcription with the patient, who gave verbal consent to proceed.  History of Present Illness NEKESHA Fritz is a 48 year old female with type 2 diabetes mellitus who presents for evaluation of a left hallux ingrown toenail and foot pain.  She has persistent discomfort from a left hallux ingrown toenail, characterized by intermittent pain exacerbated by water exposure, such as during showering. She occasionally experiences lancing pain radiating to the adjacent toe. Symptoms fluctuate and are not severe at the time of visit. She notes a dark discoloration on the nail, possibly related to nail polish or superficial trauma. Despite regular pedicures, including both traditional and waterless methods, and ongoing nail care, she has recurrent ingrown toenails.  She reports intermittent plantar pain, primarily in the arch and insole regions, which occurs sporadically while sitting and is relieved by ambulation. She wears slippers at home to avoid direct contact with the floor.  She frequently experiences cold sensations in her feet and toes, described as feeling like icicles, though she has not confirmed objective coolness. She expresses concern regarding diabetes-related complications, including neuropathy and risk of toe loss. She has had type 2 diabetes for approximately two years, with her most recent hemoglobin A1c in the 5% range and initial diagnosis in the high 6% range. She uses a Dexcom continuous glucose monitor, which was recently replaced after a period of nonuse.      Objective:    Physical  Exam VASCULAR: DP and PT pulses palpable bilaterally, 2+. Foot is warm and well-perfused. Capillary fill time is brisk. DERMATOLOGIC: Pincer nail deformity of left hallux nail. Ingrown toenail on left hallux, medial and lateral borders. Normal skin turgor, texture, and temperature. No open lesions, rashes, or ulcerations. NEUROLOGIC: Normal sensation to light touch and pressure. No paresthesias on examination. ORTHOPEDIC: Smooth pain-free range of motion of all examined joints. No ecchymosis or bruising. No gross deformity. No pain on palpation of plantar medial arch. Pes planus deformity present.   No images are attached to the encounter.    Results Partial nail avulsion with chemical matricectomy, left hallux   Ingrown Nail, bilaterally -Patient elects to proceed with minor surgery to remove ingrown toenail today. Consent reviewed and signed by patient. -Ingrown nail excised. See procedure note. -Educated on post-procedure care including soaking. Written instructions provided and reviewed. -Rx for Cortisporin  sent to pharmacy. -Advised on signs and symptoms of infection developing.  We discussed that the phenol likely will create some redness and edema and tenderness around the nailbed as long as it is localized this is to be expected.  Will return as needed if any infection signs develop  Procedure: Excision of Ingrown Toenail Location: Bilateral 1st toe medial nail borders. Anesthesia: Lidocaine 1% plain; 1.5 mL and Marcaine 0.5% plain; 1.5 mL, digital block. Skin Prep: Betadine. Dressing: Silvadene; telfa; dry, sterile, compression dressing. Technique: Following skin prep, the toe was exsanguinated and a tourniquet was secured at the base of the toe. The affected nail border was freed, split with a nail splitter, and excised. Chemical matrixectomy was then performed with  phenol and irrigated out with alcohol. The tourniquet was then removed and sterile dressing applied. Disposition:  Patient tolerated procedure well.  .   Assessment:   1. Ingrown nail   2. Plantar fasciitis      Plan:  Patient was evaluated and treated and all questions answered.  Assessment and Plan Assessment & Plan Left hallux ingrown toenail Chronic, intermittently symptomatic left hallux ingrown toenail with pincer nail deformity, recurrent despite regular pedicures and nail care. No evidence of infection or vascular compromise. Discussed partial nail avulsion with chemical matrixectomy as definitive treatment to prevent recurrence. Reviewed procedure details, including local anesthesia, removal of affected nail borders, and chemical ablation of the nail matrix. Anticipated mild postoperative soreness manageable with acetaminophen  or NSAIDs, minimal downtime, and rapid return to normal activities. Emphasized importance of post-procedure care and salon hygiene, particularly given her diabetes. - Performed partial nail avulsion with chemical matrixectomy of the left hallux under local anesthesia in the office. - Prescribed topical antibiotic and corticosteroid drops for post-procedure inflammation and infection prophylaxis. - Provided instructions for postoperative soaking and foot elevation to reduce discomfort. - Advised resumption of normal activities and shoe wear after the procedure. - Instructed that mild soreness is expected for several days, typically manageable with acetaminophen  or NSAIDs. - Advised to defer pedicures for approximately one month and to ensure proper salon sanitation practices thereafter.  Plantar fasciitis with pes planus Intermittent plantar fasciitis associated with pes planus. Conservative management indicated. - Provided plantar fascia stretching exercises and arch support recommendations. - Recommended use of house shoes with arch support, specifically [employer]OF[employer] brand, for optimal cushioning and support.        Return if symptoms worsen or fail to improve.

## 2024-02-15 ENCOUNTER — Other Ambulatory Visit: Payer: Self-pay | Admitting: Pulmonary Disease

## 2024-02-15 ENCOUNTER — Other Ambulatory Visit (HOSPITAL_BASED_OUTPATIENT_CLINIC_OR_DEPARTMENT_OTHER): Payer: Self-pay

## 2024-02-15 MED ORDER — ESOMEPRAZOLE MAGNESIUM 40 MG PO CPDR
40.0000 mg | DELAYED_RELEASE_CAPSULE | Freq: Every day | ORAL | 2 refills | Status: AC
  Filled 2024-02-15: qty 30, 30d supply, fill #0
  Filled 2024-03-29: qty 30, 30d supply, fill #1

## 2024-02-23 ENCOUNTER — Other Ambulatory Visit (HOSPITAL_BASED_OUTPATIENT_CLINIC_OR_DEPARTMENT_OTHER): Payer: Self-pay

## 2024-02-23 MED ORDER — FUROSEMIDE 20 MG PO TABS
20.0000 mg | ORAL_TABLET | Freq: Every day | ORAL | 0 refills | Status: AC
  Filled 2024-02-23: qty 30, 30d supply, fill #0
  Filled 2024-03-29: qty 30, 30d supply, fill #1

## 2024-03-04 ENCOUNTER — Other Ambulatory Visit (HOSPITAL_BASED_OUTPATIENT_CLINIC_OR_DEPARTMENT_OTHER): Payer: Self-pay

## 2024-03-14 ENCOUNTER — Other Ambulatory Visit (HOSPITAL_BASED_OUTPATIENT_CLINIC_OR_DEPARTMENT_OTHER): Payer: Self-pay

## 2024-03-14 MED ORDER — DEXCOM G7 SENSOR MISC
3 refills | Status: AC
  Filled 2024-03-14: qty 3, 30d supply, fill #0
  Filled 2024-03-16 (×2): qty 2, 30d supply, fill #0

## 2024-03-16 ENCOUNTER — Other Ambulatory Visit (HOSPITAL_BASED_OUTPATIENT_CLINIC_OR_DEPARTMENT_OTHER): Payer: Self-pay

## 2024-03-30 ENCOUNTER — Other Ambulatory Visit (HOSPITAL_BASED_OUTPATIENT_CLINIC_OR_DEPARTMENT_OTHER): Payer: Self-pay

## 2024-05-12 ENCOUNTER — Ambulatory Visit: Admitting: Sleep Medicine

## 2024-05-18 ENCOUNTER — Ambulatory Visit: Admitting: Neurology
